# Patient Record
Sex: Female | Born: 1985 | Race: White | Hispanic: No | Marital: Single | Smoking: Current every day smoker
Health system: Southern US, Community
[De-identification: ages and names within clinical notes are randomized; demographics above are authoritative.]

## PROBLEM LIST (undated history)

## (undated) DIAGNOSIS — Z8619 Personal history of other infectious and parasitic diseases: Secondary | ICD-10-CM

## (undated) DIAGNOSIS — G479 Sleep disorder, unspecified: Secondary | ICD-10-CM

## (undated) DIAGNOSIS — F17200 Nicotine dependence, unspecified, uncomplicated: Secondary | ICD-10-CM

## (undated) DIAGNOSIS — L720 Epidermal cyst: Secondary | ICD-10-CM

## (undated) DIAGNOSIS — O139 Gestational [pregnancy-induced] hypertension without significant proteinuria, unspecified trimester: Secondary | ICD-10-CM

## (undated) DIAGNOSIS — F419 Anxiety disorder, unspecified: Secondary | ICD-10-CM

## (undated) DIAGNOSIS — N939 Abnormal uterine and vaginal bleeding, unspecified: Secondary | ICD-10-CM

## (undated) DIAGNOSIS — F32A Depression, unspecified: Secondary | ICD-10-CM

## (undated) DIAGNOSIS — F988 Other specified behavioral and emotional disorders with onset usually occurring in childhood and adolescence: Secondary | ICD-10-CM

## (undated) DIAGNOSIS — N200 Calculus of kidney: Secondary | ICD-10-CM

## (undated) DIAGNOSIS — K8020 Calculus of gallbladder without cholecystitis without obstruction: Secondary | ICD-10-CM

## (undated) DIAGNOSIS — F329 Major depressive disorder, single episode, unspecified: Secondary | ICD-10-CM

## (undated) HISTORY — PX: LEEP: SHX91

## (undated) HISTORY — PX: WISDOM TOOTH EXTRACTION: SHX21

---

## 2011-02-23 DIAGNOSIS — F988 Other specified behavioral and emotional disorders with onset usually occurring in childhood and adolescence: Secondary | ICD-10-CM | POA: Insufficient documentation

## 2013-02-20 NOTE — L&D Delivery Note (Signed)
Delivery Note At 7:24 PM a viable female was delivered via Vaginal, Spontaneous Delivery (Presentation: LOA).  APGAR: 8, 9; weight 9 lb 2.6 oz (4155 g).   Placenta status: delivered, intact, spontaneously.  Cord: 3 vc  with the following complications: none.  Anesthesia: Epidural  Lacerations: none Est. Blood Loss (mL): 300  Mom to postpartum.  Baby to Couplet care / Skin to Skin.  Arling Cerone JEHIEL 12/16/2013, 8:54 PM

## 2013-05-09 DIAGNOSIS — Z9889 Other specified postprocedural states: Secondary | ICD-10-CM | POA: Insufficient documentation

## 2013-05-29 LAB — OB RESULTS CONSOLE HEPATITIS B SURFACE ANTIGEN: HEP B S AG: NEGATIVE

## 2013-05-29 LAB — OB RESULTS CONSOLE ABO/RH: RH TYPE: POSITIVE

## 2013-05-29 LAB — OB RESULTS CONSOLE RPR: RPR: NONREACTIVE

## 2013-05-29 LAB — OB RESULTS CONSOLE HIV ANTIBODY (ROUTINE TESTING): HIV: NONREACTIVE

## 2013-05-29 LAB — OB RESULTS CONSOLE RUBELLA ANTIBODY, IGM: Rubella: IMMUNE

## 2013-06-16 LAB — OB RESULTS CONSOLE GC/CHLAMYDIA
Chlamydia: NEGATIVE
Gonorrhea: NEGATIVE

## 2013-09-29 DIAGNOSIS — F32A Depression, unspecified: Secondary | ICD-10-CM | POA: Insufficient documentation

## 2013-09-29 DIAGNOSIS — F329 Major depressive disorder, single episode, unspecified: Secondary | ICD-10-CM | POA: Insufficient documentation

## 2013-10-07 ENCOUNTER — Encounter: Payer: Self-pay | Admitting: Obstetrics & Gynecology

## 2013-10-07 ENCOUNTER — Ambulatory Visit (INDEPENDENT_AMBULATORY_CARE_PROVIDER_SITE_OTHER): Payer: Medicaid Other | Admitting: Obstetrics & Gynecology

## 2013-10-07 VITALS — BP 131/79 | HR 81 | Ht 66.0 in | Wt 177.0 lb

## 2013-10-07 DIAGNOSIS — Z3490 Encounter for supervision of normal pregnancy, unspecified, unspecified trimester: Secondary | ICD-10-CM | POA: Insufficient documentation

## 2013-10-07 DIAGNOSIS — O3440 Maternal care for other abnormalities of cervix, unspecified trimester: Secondary | ICD-10-CM | POA: Insufficient documentation

## 2013-10-07 DIAGNOSIS — O3443 Maternal care for other abnormalities of cervix, third trimester: Secondary | ICD-10-CM

## 2013-10-07 DIAGNOSIS — Z23 Encounter for immunization: Secondary | ICD-10-CM

## 2013-10-07 DIAGNOSIS — O26899 Other specified pregnancy related conditions, unspecified trimester: Secondary | ICD-10-CM

## 2013-10-07 DIAGNOSIS — M549 Dorsalgia, unspecified: Secondary | ICD-10-CM

## 2013-10-07 DIAGNOSIS — Z3480 Encounter for supervision of other normal pregnancy, unspecified trimester: Secondary | ICD-10-CM | POA: Diagnosis not present

## 2013-10-07 DIAGNOSIS — Z9889 Other specified postprocedural states: Secondary | ICD-10-CM

## 2013-10-07 DIAGNOSIS — O99891 Other specified diseases and conditions complicating pregnancy: Secondary | ICD-10-CM

## 2013-10-07 DIAGNOSIS — O9989 Other specified diseases and conditions complicating pregnancy, childbirth and the puerperium: Secondary | ICD-10-CM

## 2013-10-07 DIAGNOSIS — R109 Unspecified abdominal pain: Secondary | ICD-10-CM

## 2013-10-07 DIAGNOSIS — Z3493 Encounter for supervision of normal pregnancy, unspecified, third trimester: Secondary | ICD-10-CM

## 2013-10-07 MED ORDER — TETANUS-DIPHTH-ACELL PERTUSSIS 5-2.5-18.5 LF-MCG/0.5 IM SUSP: 0.5000 mL | Freq: Once | INTRAMUSCULAR | Status: DC

## 2013-10-07 MED ORDER — CYCLOBENZAPRINE HCL 10 MG PO TABS: 10.0000 mg | ORAL_TABLET | Freq: Three times a day (TID) | ORAL | Status: DC | PRN

## 2013-10-07 NOTE — Progress Notes (Signed)
[facility]  Has two term SVDs.  History of LEEP; normal cervical lengths earlier in pregnancy. Abnormal 1 hr GTT; normal 3 hr GTT [employer] service explained in detail; all questions answered. Emphasized involvement of CNMs, residents, students etc.  Patient is concerned about worsening epigastric/upper abdominal pain that occurs every 2 hours and not related to fetal movement or meals.  Pain is alleviated by lying down. Pain is worsening in intensity, starts in epigastrium and radiates bilaterally to the middle of the upper back.  No nausea, vomiting or other symptoms; just the pain.  She is concerned about gallbladder disease.  Could be musculoskeletal,patient agreed to trial of Flexeril.  Will also obtain abdominal ultrasound; will follow up results and manage accordingly. Tdap given today. No other complaints or concerns.  Labor and fetal movement precautions reviewed.

## 2013-10-07 NOTE — Patient Instructions (Signed)
Return to clinic for any obstetric concerns or go to [facility] for evaluation Breastfeeding Deciding to breastfeed is one of the best choices you can make for you and your baby. A change in hormones during pregnancy causes your breast tissue to grow and increases the number and size of your milk ducts. These hormones also allow proteins, sugars, and fats from your blood supply to make breast milk in your milk-producing glands. Hormones prevent breast milk from being released before your baby is born as well as prompt milk flow after birth. Once breastfeeding has begun, thoughts of your baby, as well as his or her sucking or crying, can stimulate the release of milk from your milk-producing glands.  BENEFITS OF BREASTFEEDING For Your Baby  Your first milk (colostrum) helps your baby's digestive system function better.   There are antibodies in your milk that help your baby fight off infections.   Your baby has a lower incidence of asthma, allergies, and sudden infant death syndrome.   The nutrients in breast milk are better for your baby than infant formulas and are designed uniquely for your baby's needs.   Breast milk improves your baby's brain development.   Your baby is less likely to develop other conditions, such as childhood obesity, asthma, or type 2 diabetes mellitus.  For You   Breastfeeding helps to create a very special bond between you and your baby.   Breastfeeding is convenient. Breast milk is always available at the correct temperature and costs nothing.   Breastfeeding helps to burn calories and helps you lose the weight gained during pregnancy.   Breastfeeding makes your uterus contract to its prepregnancy size faster and slows bleeding (lochia) after you give birth.   Breastfeeding helps to lower your risk of developing type 2 diabetes mellitus, osteoporosis, and breast or ovarian cancer later in life. SIGNS THAT YOUR BABY IS HUNGRY Early Signs of  Hunger  Increased alertness or activity.  Stretching.  Movement of the head from side to side.  Movement of the head and opening of the mouth when the corner of the mouth or cheek is stroked (rooting).  Increased sucking sounds, smacking lips, cooing, sighing, or squeaking.  Hand-to-mouth movements.  Increased sucking of fingers or hands. Late Signs of Hunger  Fussing.  Intermittent crying. Extreme Signs of Hunger Signs of extreme hunger will require calming and consoling before your baby will be able to breastfeed successfully. Do not wait for the following signs of extreme hunger to occur before you initiate breastfeeding:   Restlessness.  A loud, strong cry.   Screaming. BREASTFEEDING BASICS Breastfeeding Initiation  Find a comfortable place to sit or lie down, with your neck and back well supported.  Place a pillow or rolled up blanket under your baby to bring him or her to the level of your breast (if you are seated). Nursing pillows are specially designed to help support your arms and your baby while you breastfeed.  Make sure that your baby's abdomen is facing your abdomen.   Gently massage your breast. With your fingertips, massage from your chest wall toward your nipple in a circular motion. This encourages milk flow. You may need to continue this action during the feeding if your milk flows slowly.  Support your breast with 4 fingers underneath and your thumb above your nipple. Make sure your fingers are well away from your nipple and your baby's mouth.   Stroke your baby's lips gently with your finger or nipple.   When  your baby's mouth is open wide enough, quickly bring your baby to your breast, placing your entire nipple and as much of the colored area around your nipple (areola) as possible into your baby's mouth.   More areola should be visible above your baby's upper lip than below the lower lip.   Your baby's tongue should be between his or her  lower gum and your breast.   Ensure that your baby's mouth is correctly positioned around your nipple (latched). Your baby's lips should create a seal on your breast and be turned out (everted).  It is common for your baby to suck about 2-3 minutes in order to start the flow of breast milk. Latching Teaching your baby how to latch on to your breast properly is very important. An improper latch can cause nipple pain and decreased milk supply for you and poor weight gain in your baby. Also, if your baby is not latched onto your nipple properly, he or she may swallow some air during feeding. This can make your baby fussy. Burping your baby when you switch breasts during the feeding can help to get rid of the air. However, teaching your baby to latch on properly is still the best way to prevent fussiness from swallowing air while breastfeeding. Signs that your baby has successfully latched on to your nipple:    Silent tugging or silent sucking, without causing you pain.   Swallowing heard between every 3-4 sucks.    Muscle movement above and in front of his or her ears while sucking.  Signs that your baby has not successfully latched on to nipple:   Sucking sounds or smacking sounds from your baby while breastfeeding.  Nipple pain. If you think your baby has not latched on correctly, slip your finger into the corner of your baby's mouth to break the suction and place it between your baby's gums. Attempt breastfeeding initiation again. Signs of Successful Breastfeeding Signs from your baby:   A gradual decrease in the number of sucks or complete cessation of sucking.   Falling asleep.   Relaxation of his or her body.   Retention of a small amount of milk in his or her mouth.   Letting go of your breast by himself or herself. Signs from you:  Breasts that have increased in firmness, weight, and size 1-3 hours after feeding.   Breasts that are softer immediately after  breastfeeding.  Increased milk volume, as well as a change in milk consistency and color by the fifth day of breastfeeding.   Nipples that are not sore, cracked, or bleeding. Signs That Your Pecola Leisure is Getting Enough Milk  Wetting at least 3 diapers in a 24-hour period. The urine should be clear and pale yellow by age 688 days.  At least 3 stools in a 24-hour period by age 688 days. The stool should be soft and yellow.  At least 3 stools in a 24-hour period by age 68 days. The stool should be seedy and yellow.  No loss of weight greater than 10% of birth weight during the first 76 days of age.  Average weight gain of 4-7 ounces (113-198 g) per week after age 70 days.  Consistent daily weight gain by age 688 days, without weight loss after the age of 2 weeks. After a feeding, your baby may spit up a small amount. This is common. BREASTFEEDING FREQUENCY AND DURATION Frequent feeding will help you make more milk and can prevent sore nipples and breast engorgement.  Breastfeed when you feel the need to reduce the fullness of your breasts or when your baby shows signs of hunger. This is called "breastfeeding on demand." Avoid introducing a pacifier to your baby while you are working to establish breastfeeding (the first 4-6 weeks after your baby is born). After this time you may choose to use a pacifier. Research has shown that pacifier use during the first year of a baby's life decreases the risk of sudden infant death syndrome (SIDS). Allow your baby to feed on each breast as long as he or she wants. Breastfeed until your baby is finished feeding. When your baby unlatches or falls asleep while feeding from the first breast, offer the second breast. Because newborns are often sleepy in the first few weeks of life, you may need to awaken your baby to get him or her to feed. Breastfeeding times will vary from baby to baby. However, the following rules can serve as a guide to help you ensure that your baby is  properly fed:  Newborns (babies 194 weeks of age or younger) may breastfeed every 1-3 hours.  Newborns should not go longer than 3 hours during the day or 5 hours during the night without breastfeeding.  You should breastfeed your baby a minimum of 8 times in a 24-hour period until you begin to introduce solid foods to your baby at around 696 months of age. BREAST MILK PUMPING Pumping and storing breast milk allows you to ensure that your baby is exclusively fed your breast milk, even at times when you are unable to breastfeed. This is especially important if you are going back to work while you are still breastfeeding or when you are not able to be present during feedings. Your lactation consultant can give you guidelines on how long it is safe to store breast milk.  A breast pump is a machine that allows you to pump milk from your breast into a sterile bottle. The pumped breast milk can then be stored in a refrigerator or freezer. Some breast pumps are operated by hand, while others use electricity. Ask your lactation consultant which type will work best for you. Breast pumps can be purchased, but some hospitals and breastfeeding support groups lease breast pumps on a monthly basis. A lactation consultant can teach you how to hand express breast milk, if you prefer not to use a pump.  CARING FOR YOUR BREASTS WHILE YOU BREASTFEED Nipples can become dry, cracked, and sore while breastfeeding. The following recommendations can help keep your breasts moisturized and healthy:  Avoid using soap on your nipples.   Wear a supportive bra. Although not required, special nursing bras and tank tops are designed to allow access to your breasts for breastfeeding without taking off your entire bra or top. Avoid wearing underwire-style bras or extremely tight bras.  Air dry your nipples for 3-814minutes after each feeding.   Use only cotton bra pads to absorb leaked breast milk. Leaking of breast milk between  feedings is normal.   Use lanolin on your nipples after breastfeeding. Lanolin helps to maintain your skin's normal moisture barrier. If you use pure lanolin, you do not need to wash it off before feeding your baby again. Pure lanolin is not toxic to your baby. You may also hand express a few drops of breast milk and gently massage that milk into your nipples and allow the milk to air dry. In the first few weeks after giving birth, some women experience extremely full breasts (  engorgement). Engorgement can make your breasts feel heavy, warm, and tender to the touch. Engorgement peaks within 3-5 days after you give birth. The following recommendations can help ease engorgement:  Completely empty your breasts while breastfeeding or pumping. You may want to start by applying warm, moist heat (in the shower or with warm water-soaked hand towels) just before feeding or pumping. This increases circulation and helps the milk flow. If your baby does not completely empty your breasts while breastfeeding, pump any extra milk after he or she is finished.  Wear a snug bra (nursing or regular) or tank top for 1-2 days to signal your body to slightly decrease milk production.  Apply ice packs to your breasts, unless this is too uncomfortable for you.  Make sure that your baby is latched on and positioned properly while breastfeeding. If engorgement persists after 48 hours of following these recommendations, contact your health care provider or a Advertising copywriter. OVERALL HEALTH CARE RECOMMENDATIONS WHILE BREASTFEEDING  Eat healthy foods. Alternate between meals and snacks, eating 3 of each per day. Because what you eat affects your breast milk, some of the foods may make your baby more irritable than usual. Avoid eating these foods if you are sure that they are negatively affecting your baby.  Drink milk, fruit juice, and water to satisfy your thirst (about 10 glasses a day).   Rest often, relax, and  continue to take your prenatal vitamins to prevent fatigue, stress, and anemia.  Continue breast self-awareness checks.  Avoid chewing and smoking tobacco.  Avoid alcohol and drug use. Some medicines that may be harmful to your baby can pass through breast milk. It is important to ask your health care provider before taking any medicine, including all over-the-counter and prescription medicine as well as vitamin and herbal supplements. It is possible to become pregnant while breastfeeding. If birth control is desired, ask your health care provider about options that will be safe for your baby. SEEK MEDICAL CARE IF:   You feel like you want to stop breastfeeding or have become frustrated with breastfeeding.  You have painful breasts or nipples.  Your nipples are cracked or bleeding.  Your breasts are red, tender, or warm.  You have a swollen area on either breast.  You have a fever or chills.  You have nausea or vomiting.  You have drainage other than breast milk from your nipples.  Your breasts do not become full before feedings by the fifth day after you give birth.  You feel sad and depressed.  Your baby is too sleepy to eat well.  Your baby is having trouble sleeping.   Your baby is wetting less than 3 diapers in a 24-hour period.  Your baby has less than 3 stools in a 24-hour period.  Your baby's skin or the white part of his or her eyes becomes yellow.   Your baby is not gaining weight by 42 days of age. SEEK IMMEDIATE MEDICAL CARE IF:   Your baby is overly tired (lethargic) and does not want to wake up and feed.  Your baby develops an unexplained fever. Document Released: 02/06/2005 Document Revised: 02/11/2013 Document Reviewed: 07/31/2012 [facility] Patient Information 2015 [place], [place]. This information is not intended to replace advice given to you by your health care provider. Make sure you discuss any questions you have with your health care provider.

## 2013-10-10 ENCOUNTER — Ambulatory Visit (HOSPITAL_COMMUNITY)
Admission: RE | Admit: 2013-10-10 | Discharge: 2013-10-10 | Disposition: A | Discharge status: Home / Self Care | Payer: Medicaid Other | Source: Ambulatory Visit | Attending: Obstetrics & Gynecology | Admitting: Obstetrics & Gynecology

## 2013-10-10 DIAGNOSIS — R109 Unspecified abdominal pain: Secondary | ICD-10-CM

## 2013-10-10 DIAGNOSIS — O26899 Other specified pregnancy related conditions, unspecified trimester: Secondary | ICD-10-CM

## 2013-10-10 DIAGNOSIS — R1013 Epigastric pain: Secondary | ICD-10-CM | POA: Insufficient documentation

## 2013-10-10 DIAGNOSIS — K8020 Calculus of gallbladder without cholecystitis without obstruction: Secondary | ICD-10-CM | POA: Insufficient documentation

## 2013-10-14 ENCOUNTER — Telehealth: Payer: Self-pay | Admitting: *Deleted

## 2013-10-14 NOTE — Telephone Encounter (Signed)
Discussed ultrasound results with the patient and she says that she is having increased pain and now severe itching.  I have advised patient to come to discuss options with the physician tomorrow.

## 2013-10-15 ENCOUNTER — Ambulatory Visit (INDEPENDENT_AMBULATORY_CARE_PROVIDER_SITE_OTHER): Payer: Medicaid Other | Admitting: Obstetrics & Gynecology

## 2013-10-15 ENCOUNTER — Ambulatory Visit (HOSPITAL_COMMUNITY): Payer: Medicaid Other

## 2013-10-15 ENCOUNTER — Encounter: Payer: Self-pay | Admitting: Obstetrics & Gynecology

## 2013-10-15 VITALS — BP 102/67 | HR 87 | Wt 178.0 lb

## 2013-10-15 DIAGNOSIS — Z3480 Encounter for supervision of other normal pregnancy, unspecified trimester: Secondary | ICD-10-CM

## 2013-10-15 DIAGNOSIS — R1011 Right upper quadrant pain: Secondary | ICD-10-CM

## 2013-10-15 DIAGNOSIS — L299 Pruritus, unspecified: Secondary | ICD-10-CM

## 2013-10-15 MED ORDER — METOCLOPRAMIDE HCL 10 MG PO TABS: 10.0000 mg | ORAL_TABLET | Freq: Three times a day (TID) | ORAL | Status: DC

## 2013-10-15 MED ORDER — RANITIDINE HCL 300 MG PO TABS: 300.0000 mg | ORAL_TABLET | Freq: Every day | ORAL | Status: DC

## 2013-10-15 NOTE — Progress Notes (Signed)
Increase in itching all over mainly at night.  Increase in gallstone/kidney stone pain.

## 2013-10-15 NOTE — Progress Notes (Signed)
Pt reports itching all over.  Was diagnosed with a 1.9cm gallstone on abd sono.   She reports pain in the upper abd +FM, No ctx, No LOF, No VB Exam- benign    reglan and Zantac Labs: Bile acids and CMP F/u 2 weeks or sooner prn

## 2013-10-15 NOTE — Patient Instructions (Addendum)
Abdominal Pain Many things can cause abdominal pain. Usually, abdominal pain is not caused by a disease and will improve without treatment. It can often be observed and treated at home. Your health care provider will do a physical exam and possibly order blood tests and X-rays to help determine the seriousness of your pain. However, in many cases, more time must pass before a clear cause of the pain can be found. Before that point, your health care provider may not know if you need more testing or further treatment. HOME CARE INSTRUCTIONS  Monitor your abdominal pain for any changes. The following actions may help to alleviate any discomfort you are experiencing:  Only take over-the-counter or prescription medicines as directed by your health care provider.  Do not take laxatives unless directed to do so by your health care provider.  Try a clear liquid diet (broth, tea, or water) as directed by your health care provider. Slowly move to a bland diet as tolerated. SEEK MEDICAL CARE IF:  You have unexplained abdominal pain.  You have abdominal pain associated with nausea or diarrhea.  You have pain when you urinate or have a bowel movement.  You experience abdominal pain that wakes you in the night.  You have abdominal pain that is worsened or improved by eating food.  You have abdominal pain that is worsened with eating fatty foods.  You have a fever. SEEK IMMEDIATE MEDICAL CARE IF:   Your pain does not go away within 2 hours.  You keep throwing up (vomiting).  Your pain is felt only in portions of the abdomen, such as the right side or the left lower portion of the abdomen.  You pass bloody or black tarry stools. MAKE SURE YOU:  Understand these instructions.   Will watch your condition.   Will get help right away if you are not doing well or get worse.  Document Released: 11/16/2004 Document Revised: 02/11/2013 Document Reviewed: 10/16/2012 [facility] Patient Information  2015 [place], [place]. This information is not intended to replace advice given to you by your health care provider. Make sure you discuss any questions you have with your health care provider. Third Trimester of Pregnancy The third trimester is from week 29 through week 42, months 7 through 9. The third trimester is a time when the fetus is growing rapidly. At the end of the ninth month, the fetus is about 20 inches in length and weighs 6-10 pounds.  BODY CHANGES Your body goes through many changes during pregnancy. The changes vary from woman to woman.   Your weight will continue to increase. You can expect to gain 25-35 pounds (11-16 kg) by the end of the pregnancy.  You may begin to get stretch marks on your hips, abdomen, and breasts.  You may urinate more often because the fetus is moving lower into your pelvis and pressing on your bladder.  You may develop or continue to have heartburn as a result of your pregnancy.  You may develop constipation because certain hormones are causing the muscles that push waste through your intestines to slow down.  You may develop hemorrhoids or swollen, bulging veins (varicose veins).  You may have pelvic pain because of the weight gain and pregnancy hormones relaxing your joints between the bones in your pelvis. Backaches may result from overexertion of the muscles supporting your posture.  You may have changes in your hair. These can include thickening of your hair, rapid growth, and changes in texture. Some women also have  hair loss during or after pregnancy, or hair that feels dry or thin. Your hair will most likely return to normal after your baby is born.  Your breasts will continue to grow and be tender. A yellow discharge may leak from your breasts called colostrum.  Your belly button may stick out.  You may feel short of breath because of your expanding uterus.  You may notice the fetus "dropping," or moving lower in your abdomen.  You may  have a bloody mucus discharge. This usually occurs a few days to a week before labor begins.  Your cervix becomes thin and soft (effaced) near your due date. WHAT TO EXPECT AT YOUR PRENATAL EXAMS  You will have prenatal exams every 2 weeks until week 36. Then, you will have weekly prenatal exams. During a routine prenatal visit:  You will be weighed to make sure you and the fetus are growing normally.  Your blood pressure is taken.  Your abdomen will be measured to track your baby's growth.  The fetal heartbeat will be listened to.  Any test results from the previous visit will be discussed.  You may have a cervical check near your due date to see if you have effaced. At around 36 weeks, your caregiver will check your cervix. At the same time, your caregiver will also perform a test on the secretions of the vaginal tissue. This test is to determine if a type of bacteria, Group B streptococcus, is present. Your caregiver will explain this further. Your caregiver may ask you:  What your birth plan is.  How you are feeling.  If you are feeling the baby move.  If you have had any abnormal symptoms, such as leaking fluid, bleeding, severe headaches, or abdominal cramping.  If you have any questions. Other tests or screenings that may be performed during your third trimester include:  Blood tests that check for low iron levels (anemia).  Fetal testing to check the health, activity level, and growth of the fetus. Testing is done if you have certain medical conditions or if there are problems during the pregnancy. FALSE LABOR You may feel small, irregular contractions that eventually go away. These are called Braxton Hicks contractions, or false labor. Contractions may last for hours, days, or even weeks before true labor sets in. If contractions come at regular intervals, intensify, or become painful, it is best to be seen by your caregiver.  SIGNS OF LABOR   Menstrual-like  cramps.  Contractions that are 5 minutes apart or less.  Contractions that start on the top of the uterus and spread down to the lower abdomen and back.  A sense of increased pelvic pressure or back pain.  A watery or bloody mucus discharge that comes from the vagina. If you have any of these signs before the 37th week of pregnancy, call your caregiver right away. You need to go to the hospital to get checked immediately. HOME CARE INSTRUCTIONS   Avoid all smoking, herbs, alcohol, and unprescribed drugs. These chemicals affect the formation and growth of the baby.  Follow your caregiver's instructions regarding medicine use. There are medicines that are either safe or unsafe to take during pregnancy.  Exercise only as directed by your caregiver. Experiencing uterine cramps is a good sign to stop exercising.  Continue to eat regular, healthy meals.  Wear a good support bra for breast tenderness.  Do not use hot tubs, steam rooms, or saunas.  Wear your seat belt at all times when  driving.  Avoid raw meat, uncooked cheese, cat litter boxes, and soil used by cats. These carry germs that can cause birth defects in the baby.  Take your prenatal vitamins.  Try taking a stool softener (if your caregiver approves) if you develop constipation. Eat more high-fiber foods, such as fresh vegetables or fruit and whole grains. Drink plenty of fluids to keep your urine clear or pale yellow.  Take warm sitz baths to soothe any pain or discomfort caused by hemorrhoids. Use hemorrhoid cream if your caregiver approves.  If you develop varicose veins, wear support hose. Elevate your feet for 15 minutes, 3-4 times a day. Limit salt in your diet.  Avoid heavy lifting, wear low heal shoes, and practice good posture.  Rest a lot with your legs elevated if you have leg cramps or low back pain.  Visit your dentist if you have not gone during your pregnancy. Use a soft toothbrush to brush your teeth and  be gentle when you floss.  A sexual relationship may be continued unless your caregiver directs you otherwise.  Do not travel far distances unless it is absolutely necessary and only with the approval of your caregiver.  Take prenatal classes to understand, practice, and ask questions about the labor and delivery.  Make a trial run to the hospital.  Pack your hospital bag.  Prepare the baby's nursery.  Continue to go to all your prenatal visits as directed by your caregiver. SEEK MEDICAL CARE IF:  You are unsure if you are in labor or if your water has broken.  You have dizziness.  You have mild pelvic cramps, pelvic pressure, or nagging pain in your abdominal area.  You have persistent nausea, vomiting, or diarrhea.  You have a bad smelling vaginal discharge.  You have pain with urination. SEEK IMMEDIATE MEDICAL CARE IF:   You have a fever.  You are leaking fluid from your vagina.  You have spotting or bleeding from your vagina.  You have severe abdominal cramping or pain.  You have rapid weight loss or gain.  You have shortness of breath with chest pain.  You notice sudden or extreme swelling of your face, hands, ankles, feet, or legs.  You have not felt your baby move in over an hour.  You have severe headaches that do not go away with medicine.  You have vision changes. Document Released: 01/31/2001 Document Revised: 02/11/2013 Document Reviewed: 04/09/2012 [facility] Patient Information 2015 [place], [place]. This information is not intended to replace advice given to you by your health care provider. Make sure you discuss any questions you have with your health care provider.

## 2013-10-16 LAB — COMPREHENSIVE METABOLIC PANEL
ALT: 9 U/L (ref 0–35)
AST: 15 U/L (ref 0–37)
Albumin: 3.2 g/dL — ABNORMAL LOW (ref 3.5–5.2)
Alkaline Phosphatase: 77 U/L (ref 39–117)
BUN: 5 mg/dL — ABNORMAL LOW (ref 6–23)
CO2: 25 meq/L (ref 19–32)
CREATININE: 0.54 mg/dL (ref 0.50–1.10)
Calcium: 8.5 mg/dL (ref 8.4–10.5)
Chloride: 103 mEq/L (ref 96–112)
GLUCOSE: 68 mg/dL — AB (ref 70–99)
Potassium: 3.9 mEq/L (ref 3.5–5.3)
SODIUM: 137 meq/L (ref 135–145)
TOTAL PROTEIN: 5.9 g/dL — AB (ref 6.0–8.3)
Total Bilirubin: 0.2 mg/dL (ref 0.2–1.2)

## 2013-10-17 LAB — BILE ACIDS, TOTAL: BILE ACIDS TOTAL: 9 umol/L (ref 0–19)

## 2013-10-20 ENCOUNTER — Telehealth: Payer: Self-pay | Admitting: *Deleted

## 2013-10-20 NOTE — Telephone Encounter (Signed)
Patient called for test results.  Notified patient of normal lab results.  She has follow up on the 8th of September and will call if she feels like her symptoms are worsening to be seen sooner.

## 2013-10-21 ENCOUNTER — Encounter: Payer: Medicaid Other | Admitting: Obstetrics & Gynecology

## 2013-10-28 ENCOUNTER — Ambulatory Visit (INDEPENDENT_AMBULATORY_CARE_PROVIDER_SITE_OTHER): Payer: Medicaid Other | Admitting: Obstetrics & Gynecology

## 2013-10-28 VITALS — BP 126/74 | HR 85 | Wt 179.0 lb

## 2013-10-28 DIAGNOSIS — O9989 Other specified diseases and conditions complicating pregnancy, childbirth and the puerperium: Secondary | ICD-10-CM

## 2013-10-28 DIAGNOSIS — O99713 Diseases of the skin and subcutaneous tissue complicating pregnancy, third trimester: Principal | ICD-10-CM

## 2013-10-28 DIAGNOSIS — L299 Pruritus, unspecified: Secondary | ICD-10-CM

## 2013-10-28 DIAGNOSIS — Z3480 Encounter for supervision of other normal pregnancy, unspecified trimester: Secondary | ICD-10-CM

## 2013-10-28 MED ORDER — HYDROXYZINE HCL 25 MG PO TABS: 25.0000 mg | ORAL_TABLET | Freq: Four times a day (QID) | ORAL | Status: DC | PRN

## 2013-10-28 NOTE — Patient Instructions (Signed)
Cholestasis of Pregnancy °Cholestasis refers to any condition that causes the flow of the digestive fluid (bile) produced by your liver to slow or stop. Cholestasis of pregnancy is most common toward the end of pregnancy (third trimester), but it can occur any time during your pregnancy. The condition often goes away soon after your baby is born.  °Cholestasis may be uncomfortable but is usually harmless to you. However, it can be harmful to your baby. Cholestasis may increase the risk that your baby will be born too early (preterm delivery).  °CAUSES  °The cause of cholestasis of pregnancy is not known. Pregnancy hormones may affect the way your gallbladder functions. Your gallbladder normally holds the bile from your liver until you need it to help digest fat in your diet. Pregnancy hormones may cause the flow of bile to slow down and back up into your liver. Bile may then get into your bloodstream and cause cholestasis symptoms. °RISK FACTORS °You may be at increased risk if: °· You had cholestasis during a previous pregnancy. °· You have a family history of cholestasis. °· You have liver problems. °· You are having twins. °SIGNS AND SYMPTOMS  °The most common symptom of cholestasis of pregnancy is intense itching, especially on the palms of your hands and soles of your feet. The itching can spread to the rest of your body and is often worse at night. You will not usually have a rash. Other symptoms may include:  °· Feeling tired.   °· Yellowish discoloration of your skin and the whites of your eyes (jaundice).   °· Dark-colored urine.   °· Light-colored stools. °· Poor appetite.    °DIAGNOSIS  °Your health care provider will take your medical history and do a physical exam. You may have blood tests to check your liver function, bile level, and bilirubin level.  °TREATMENT  °Treatment is meant to make you more comfortable and keep your baby safe. Your health care provider may prescribe medicine to relieve your  itching. The medicine used may also improve your blood test results and help keep your baby safe. Your health care provider may also give you vitamin K before delivery to prevent excessive bleeding.  °Your health care provider may want to check your baby (fetal monitoring) frequently, as often as every 2 weeks. Once your baby's lungs have developed enough, your health care provider may recommend starting (inducing) your labor and delivery by week 37 of your pregnancy. °HOME CARE INSTRUCTIONS  °· Only use anti-itch creams and take medicines as directed by your health care provider. °· Take cool baths to soothe your itching.   °· Keep your fingernails short to prevent skin irritation from scratching.   °· Keep all your appointments for fetal monitoring. °SEEK MEDICAL CARE IF:  °Your symptoms get worse, even with treatment. °SEEK IMMEDIATE MEDICAL CARE IF: °You go into early labor at home. °MAKE SURE YOU: °· Understand these instructions. °· Will watch your condition. °· Will get help right away if you are not doing well or get worse. °Document Released: 02/04/2000 Document Revised: 06/23/2013 Document Reviewed: 11/29/2012 °[employer]® Patient Information ©2015 [employer]. This information is not intended to replace advice given to you by your health care provider. Make sure you discuss any questions you have with your health care provider. ° °

## 2013-10-28 NOTE — Progress Notes (Signed)
Still has itching; 10/15/13 Bile acid level was 9.  On Benadryl now, helps a little. Atarax prescribed in lieu of Benadryl for now. Will follow up labs; discussed concern about ICP with patient and its implications. No other complaints or concerns.  Labor and fetal movement precautions reviewed.

## 2013-10-29 LAB — COMPREHENSIVE METABOLIC PANEL
ALK PHOS: 78 U/L (ref 39–117)
ALT: 11 U/L (ref 0–35)
AST: 14 U/L (ref 0–37)
Albumin: 2.9 g/dL — ABNORMAL LOW (ref 3.5–5.2)
BUN: 5 mg/dL — ABNORMAL LOW (ref 6–23)
CO2: 25 mEq/L (ref 19–32)
Calcium: 8.6 mg/dL (ref 8.4–10.5)
Chloride: 103 mEq/L (ref 96–112)
Creat: 0.56 mg/dL (ref 0.50–1.10)
Glucose, Bld: 90 mg/dL (ref 70–99)
Potassium: 4.1 mEq/L (ref 3.5–5.3)
SODIUM: 134 meq/L — AB (ref 135–145)
TOTAL PROTEIN: 5.2 g/dL — AB (ref 6.0–8.3)
Total Bilirubin: 0.2 mg/dL (ref 0.2–1.2)

## 2013-10-30 LAB — BILE ACIDS, TOTAL: Bile Acids Total: 8 umol/L (ref 0–19)

## 2013-11-11 ENCOUNTER — Encounter: Payer: Medicaid Other | Admitting: Family Medicine

## 2013-11-19 ENCOUNTER — Ambulatory Visit (INDEPENDENT_AMBULATORY_CARE_PROVIDER_SITE_OTHER): Payer: Medicaid Other | Admitting: Obstetrics & Gynecology

## 2013-11-19 ENCOUNTER — Encounter: Payer: Self-pay | Admitting: Obstetrics & Gynecology

## 2013-11-19 VITALS — BP 132/81 | HR 82 | Wt 178.6 lb

## 2013-11-19 DIAGNOSIS — Z113 Encounter for screening for infections with a predominantly sexual mode of transmission: Secondary | ICD-10-CM

## 2013-11-19 DIAGNOSIS — Z3490 Encounter for supervision of normal pregnancy, unspecified, unspecified trimester: Secondary | ICD-10-CM

## 2013-11-19 DIAGNOSIS — Z3480 Encounter for supervision of other normal pregnancy, unspecified trimester: Secondary | ICD-10-CM

## 2013-11-19 DIAGNOSIS — Z3685 Encounter for antenatal screening for Streptococcus B: Secondary | ICD-10-CM

## 2013-11-19 LAB — OB RESULTS CONSOLE GC/CHLAMYDIA
Chlamydia: NEGATIVE
GC PROBE AMP, GENITAL: NEGATIVE

## 2013-11-19 LAB — OB RESULTS CONSOLE GBS: STREP GROUP B AG: NEGATIVE

## 2013-11-19 NOTE — Progress Notes (Signed)
Seen last  Tuesday in Emergency room for suspected PE  Ended up being bronchitis.  She was treated with z-pack but it still coughing tremendously.  Yesterday she heard a pop and feels like she may have broken a rib.  She was seen a urgent care and they did not want to do any further x-rays due to all of the radiation that she had in the hospital when she was admitted for 2 days.

## 2013-11-19 NOTE — Progress Notes (Signed)
Routine visit. Good FM. Reassurance given regarding her ribs and their recovery. GBS and cervical cultures obtained today. Labor precautions reviewed.

## 2013-11-20 LAB — GC/CHLAMYDIA PROBE AMP
CT Probe RNA: NEGATIVE
GC Probe RNA: NEGATIVE

## 2013-11-22 LAB — CULTURE, BETA STREP (GROUP B ONLY)

## 2013-11-25 ENCOUNTER — Encounter: Payer: Self-pay | Admitting: Obstetrics & Gynecology

## 2013-11-26 ENCOUNTER — Ambulatory Visit (INDEPENDENT_AMBULATORY_CARE_PROVIDER_SITE_OTHER): Payer: Medicaid Other | Admitting: Obstetrics & Gynecology

## 2013-11-26 VITALS — BP 137/81 | HR 80 | Wt 181.0 lb

## 2013-11-26 DIAGNOSIS — O99713 Diseases of the skin and subcutaneous tissue complicating pregnancy, third trimester: Principal | ICD-10-CM

## 2013-11-26 DIAGNOSIS — Z3493 Encounter for supervision of normal pregnancy, unspecified, third trimester: Secondary | ICD-10-CM

## 2013-11-26 DIAGNOSIS — O26893 Other specified pregnancy related conditions, third trimester: Secondary | ICD-10-CM

## 2013-11-26 DIAGNOSIS — Z3A37 37 weeks gestation of pregnancy: Secondary | ICD-10-CM

## 2013-11-26 DIAGNOSIS — L299 Pruritus, unspecified: Secondary | ICD-10-CM

## 2013-11-26 DIAGNOSIS — O368130 Decreased fetal movements, third trimester, not applicable or unspecified: Secondary | ICD-10-CM

## 2013-11-26 LAB — COMPREHENSIVE METABOLIC PANEL
ALBUMIN: 3 g/dL — AB (ref 3.5–5.2)
ALT: 12 U/L (ref 0–35)
AST: 19 U/L (ref 0–37)
Alkaline Phosphatase: 104 U/L (ref 39–117)
BUN: 6 mg/dL (ref 6–23)
CALCIUM: 8.4 mg/dL (ref 8.4–10.5)
CHLORIDE: 102 meq/L (ref 96–112)
CO2: 25 meq/L (ref 19–32)
Creat: 0.53 mg/dL (ref 0.50–1.10)
GLUCOSE: 82 mg/dL (ref 70–99)
Potassium: 4.1 mEq/L (ref 3.5–5.3)
SODIUM: 135 meq/L (ref 135–145)
TOTAL PROTEIN: 5.8 g/dL — AB (ref 6.0–8.3)
Total Bilirubin: 0.3 mg/dL (ref 0.2–1.2)

## 2013-11-26 NOTE — Patient Instructions (Signed)
Return to clinic for any obstetric concerns or go to MAU for evaluation  

## 2013-11-26 NOTE — Progress Notes (Signed)
Pruritus has worsened again, got better for a while but back now in the last two days.  Bile acids rechecked with CMET; will follow up results and manage accordingly. Also reports associated decreased fetal movement in last two days; feels about 2 kicks a day and at baseline fetus was very active. NST in clinic today was reactive. GBS negative. No other complaints or concerns.  Strict labor and fetal movement precautions reviewed.

## 2013-11-28 LAB — BILE ACIDS, TOTAL: BILE ACIDS TOTAL: 9 umol/L (ref 0–19)

## 2013-12-01 ENCOUNTER — Encounter: Payer: Self-pay | Admitting: Obstetrics & Gynecology

## 2013-12-01 ENCOUNTER — Ambulatory Visit (INDEPENDENT_AMBULATORY_CARE_PROVIDER_SITE_OTHER): Payer: Medicaid Other | Admitting: Obstetrics & Gynecology

## 2013-12-01 VITALS — BP 133/78 | HR 99 | Wt 182.8 lb

## 2013-12-01 DIAGNOSIS — O368131 Decreased fetal movements, third trimester, fetus 1: Secondary | ICD-10-CM

## 2013-12-01 DIAGNOSIS — O99713 Diseases of the skin and subcutaneous tissue complicating pregnancy, third trimester: Principal | ICD-10-CM

## 2013-12-01 DIAGNOSIS — Z3493 Encounter for supervision of normal pregnancy, unspecified, third trimester: Secondary | ICD-10-CM

## 2013-12-01 DIAGNOSIS — O26893 Other specified pregnancy related conditions, third trimester: Secondary | ICD-10-CM

## 2013-12-01 DIAGNOSIS — L299 Pruritus, unspecified: Secondary | ICD-10-CM

## 2013-12-01 NOTE — Progress Notes (Signed)
NST is reactive, BP recheck is 133/78.  Patient will return in 3 days for BP check and NST if needed.

## 2013-12-01 NOTE — Patient Instructions (Signed)
Return to clinic for any obstetric concerns or go to MAU for evaluation  

## 2013-12-01 NOTE — Progress Notes (Signed)
Bile acids on 11/26/13 was 9, normal CMET.  BP today is 138/92, denies any headaches, visual symptoms, abdominal pain.  Reevaluate at next visit later this week. Improved fetal movement perceived by patient.  Will still obtain NST today given elevated BP.  Preeclampsia, labor and fetal movement precautions reviewed. Addendum: NST performed today was reviewed and was found to be reactive.  Reevaluate BP later in the week

## 2013-12-04 ENCOUNTER — Encounter: Payer: Self-pay | Admitting: Obstetrics and Gynecology

## 2013-12-04 ENCOUNTER — Encounter (HOSPITAL_COMMUNITY): Payer: Self-pay | Admitting: *Deleted

## 2013-12-04 ENCOUNTER — Ambulatory Visit (INDEPENDENT_AMBULATORY_CARE_PROVIDER_SITE_OTHER): Payer: Medicaid Other | Admitting: Obstetrics and Gynecology

## 2013-12-04 ENCOUNTER — Inpatient Hospital Stay (HOSPITAL_COMMUNITY)
Admission: AD | Admit: 2013-12-04 | Discharge: 2013-12-04 | Disposition: A | Discharge status: Home / Self Care | Payer: Medicaid Other | Source: Ambulatory Visit | Attending: Family Medicine | Admitting: Family Medicine

## 2013-12-04 VITALS — BP 130/90 | HR 87 | Wt 182.0 lb

## 2013-12-04 DIAGNOSIS — R030 Elevated blood-pressure reading, without diagnosis of hypertension: Secondary | ICD-10-CM | POA: Diagnosis present

## 2013-12-04 DIAGNOSIS — O9989 Other specified diseases and conditions complicating pregnancy, childbirth and the puerperium: Secondary | ICD-10-CM | POA: Diagnosis not present

## 2013-12-04 DIAGNOSIS — Z9889 Other specified postprocedural states: Principal | ICD-10-CM

## 2013-12-04 DIAGNOSIS — Z3493 Encounter for supervision of normal pregnancy, unspecified, third trimester: Secondary | ICD-10-CM

## 2013-12-04 DIAGNOSIS — O26893 Other specified pregnancy related conditions, third trimester: Secondary | ICD-10-CM

## 2013-12-04 DIAGNOSIS — O163 Unspecified maternal hypertension, third trimester: Secondary | ICD-10-CM

## 2013-12-04 DIAGNOSIS — Z3A38 38 weeks gestation of pregnancy: Secondary | ICD-10-CM | POA: Insufficient documentation

## 2013-12-04 DIAGNOSIS — L299 Pruritus, unspecified: Secondary | ICD-10-CM

## 2013-12-04 DIAGNOSIS — O99333 Smoking (tobacco) complicating pregnancy, third trimester: Secondary | ICD-10-CM | POA: Diagnosis not present

## 2013-12-04 DIAGNOSIS — O3443 Maternal care for other abnormalities of cervix, third trimester: Secondary | ICD-10-CM

## 2013-12-04 DIAGNOSIS — O99713 Diseases of the skin and subcutaneous tissue complicating pregnancy, third trimester: Secondary | ICD-10-CM

## 2013-12-04 HISTORY — DX: Calculus of gallbladder without cholecystitis without obstruction: K80.20

## 2013-12-04 HISTORY — DX: Calculus of kidney: N20.0

## 2013-12-04 LAB — CBC
HEMATOCRIT: 35.3 % — AB (ref 36.0–46.0)
Hemoglobin: 11.4 g/dL — ABNORMAL LOW (ref 12.0–15.0)
MCH: 25.6 pg — ABNORMAL LOW (ref 26.0–34.0)
MCHC: 32.3 g/dL (ref 30.0–36.0)
MCV: 79.3 fL (ref 78.0–100.0)
Platelets: 208 10*3/uL (ref 150–400)
RBC: 4.45 MIL/uL (ref 3.87–5.11)
RDW: 15 % (ref 11.5–15.5)
WBC: 11 10*3/uL — ABNORMAL HIGH (ref 4.0–10.5)

## 2013-12-04 LAB — COMPREHENSIVE METABOLIC PANEL
ALT: 11 U/L (ref 0–35)
ANION GAP: 14 (ref 5–15)
AST: 18 U/L (ref 0–37)
Albumin: 2.3 g/dL — ABNORMAL LOW (ref 3.5–5.2)
Alkaline Phosphatase: 134 U/L — ABNORMAL HIGH (ref 39–117)
BUN: 5 mg/dL — AB (ref 6–23)
CO2: 22 mEq/L (ref 19–32)
Calcium: 8.5 mg/dL (ref 8.4–10.5)
Chloride: 102 mEq/L (ref 96–112)
Creatinine, Ser: 0.54 mg/dL (ref 0.50–1.10)
GFR calc Af Amer: >90 mL/min (ref >90)
GFR calc non Af Amer: >90 mL/min (ref >90)
Glucose, Bld: 101 mg/dL — ABNORMAL HIGH (ref 70–99)
Potassium: 3.7 mEq/L (ref 3.7–5.3)
SODIUM: 138 meq/L (ref 137–147)
TOTAL PROTEIN: 5.8 g/dL — AB (ref 6.0–8.3)
Total Bilirubin: <0.2 mg/dL — ABNORMAL LOW (ref 0.3–1.2)

## 2013-12-04 LAB — PROTEIN / CREATININE RATIO, URINE
Creatinine, Urine: 73.49 mg/dL
PROTEIN CREATININE RATIO: 0.16 — AB (ref 0.00–0.15)
Total Protein, Urine: 11.9 mg/dL

## 2013-12-04 MED ORDER — OXYCODONE-ACETAMINOPHEN 5-325 MG PO TABS: 2.0000 | ORAL_TABLET | Freq: Once | ORAL | Status: DC

## 2013-12-04 NOTE — Discharge Instructions (Signed)

## 2013-12-04 NOTE — MAU Provider Note (Signed)
History     CSN: [id number]  Arrival date and time: 12/04/13 1121   None     Chief Complaint  Patient presents with  . Hypertension   HPI  Patient is 28 y.o. [id number] here for preeclampsia eval.  +FM, denies LOF, VB, contractions, vaginal discharge.   here for preeclampsia evaluation, referred from [facility] for evaluation after blood pressure of 130/92 noted.  Patient very concerned reporting her blood pressure has been progressively worsening at each clinic visit. - no headache, no RUQ, no visual changes, +swelling hands/feet.   Past Medical History  Diagnosis Date  . Medical history non-contributory   . Gall stone   . Kidney calculi     Past Surgical History  Procedure Laterality Date  . No past surgeries      Family History  Problem Relation Age of Onset  . Cancer Mother     skin  . Diabetes Mother   . Hypertension Mother   . Hypertension Father   . Cancer Maternal Grandmother     breast  . Cancer Maternal Grandfather     lung  . Cancer Paternal Grandmother     lung  . Cancer Paternal Grandfather     lung    History  Substance Use Topics  . Smoking status: Current Every Day Smoker -- 0.50 packs/day  . Smokeless tobacco: Never Used  . Alcohol Use: No    Allergies:  Allergies  Allergen Reactions  . Cefaclor Other (See Comments)    Query joint inflammation    Facility-administered medications prior to admission  Medication Dose Route Frequency Provider Last Rate Last Dose  . Tdap (BOOSTRIX) injection 0.5 mL  0.5 mL Intramuscular Once Tereso NewcomerUgonna A Anyanwu, MD       Prescriptions prior to admission  Medication Sig Dispense Refill  . acetaminophen (TYLENOL) 500 MG tablet Take 1,000 mg by mouth daily as needed for mild pain or moderate pain.      . cyclobenzaprine (FLEXERIL) 10 MG tablet Take 1 tablet (10 mg total) by mouth 3 (three) times daily as needed for muscle spasms.  30 tablet  2  . ferrous sulfate 325 (65 FE) MG tablet Take 325 mg  by mouth daily with breakfast.      . Prenatal Vit-Fe Fumarate-FA (PRENATAL MULTIVITAMIN) TABS tablet Take 1 tablet by mouth daily at 12 noon.        Review of Systems  Constitutional: Negative for fever and chills.  Respiratory: Negative for cough and shortness of breath.   Cardiovascular: Negative for chest pain and leg swelling.  Gastrointestinal: Negative for heartburn, nausea, vomiting and diarrhea.  Genitourinary: Negative for dysuria, urgency, frequency and hematuria.  Neurological:       No headache   Physical Exam   Blood pressure 120/80, pulse 88, temperature 97.6 F (36.4 C), temperature source Oral, resp. rate 16, height 5' 5.5" (1.664 m), weight 182 lb (82.555 kg), last menstrual period 03/10/2013.  Physical Exam  Constitutional: She is oriented to person, place, and time. She appears well-developed and well-nourished.  HENT:  Head: Normocephalic and atraumatic.  Eyes: Conjunctivae and EOM are normal.  Neck: Normal range of motion.  Cardiovascular: Normal rate, regular rhythm and normal heart sounds.   Respiratory: Effort normal. No respiratory distress.  GI: Soft. Bowel sounds are normal. She exhibits no distension. There is no tenderness.  Musculoskeletal: Normal range of motion. She exhibits edema (trace pedal, no swelling in hands noted).  Neurological: She is alert and oriented  to person, place, and time.  Skin: Skin is warm and dry. No erythema.    MAU Course  Procedures  MDM NST: reactive  CBC    Component Value Date/Time   WBC 11.0* 12/04/2013 1152   RBC 4.45 12/04/2013 1152   HGB 11.4* 12/04/2013 1152   HCT 35.3* 12/04/2013 1152   PLT 208 12/04/2013 1152   MCV 79.3 12/04/2013 1152   MCH 25.6* 12/04/2013 1152   MCHC 32.3 12/04/2013 1152   RDW 15.0 12/04/2013 1152    Results for Felicia BruinsWILSON, Jaquay (MRN [id number]) as of 12/05/2013 12:36  Ref. Range 12/04/2013 11:24 12/04/2013 11:52  WBC Latest Range: 4.0-10.5 K/uL  11.0 (H)  RBC Latest Range:  3.87-5.11 MIL/uL  4.45  Hemoglobin Latest Range: 12.0-15.0 g/dL  40.911.4 (L)  HCT Latest Range: 36.0-46.0 %  35.3 (L)  MCV Latest Range: 78.0-100.0 fL  79.3  MCH Latest Range: 26.0-34.0 pg  25.6 (L)  MCHC Latest Range: 30.0-36.0 g/dL  81.132.3  RDW Latest Range: 11.5-15.5 %  15.0  Platelets Latest Range: 150-400 K/uL  208  Glucose Latest Range: 70-99 mg/dL  914101 (H)  Total Protein, Urine No range found 11.9   Protein Creatinine Ratio Latest Range: 0.00-0.15  0.16 (H)   Creatinine, Urine No range found 73.49     Assessment and Plan  Patient is 28 y.o. [id number] 278w4d here for preeclampsia eval - fetal kick counts reinforced - preterm labor precautions - labs negative, has not had 2 elevated blood pressures in 4 hours, blood pressures reviewed from clinic and have consistently been 130s/80s.  At this time unable to diagnose gestational hypertension, discharge with preeclampsia precautions.  Perry MountACOSTA,Lesslie Mckeehan ROCIO, MD 12:37 PM   Felicia Price 12/04/2013, 2:47 PM

## 2013-12-04 NOTE — Progress Notes (Signed)
Patient complaining of daily headaches not always relieved by tylenol. She is very concerned about the possibility of preeclampsia as she had it with her previous pregnancy. BP borderline today. Will send over to MAU for further evaluation given persistent HA. FM/labor precautions reviewed

## 2013-12-04 NOTE — MAU Note (Addendum)
BP has been elevated.  Severe headaches every morning, dizziness. Now having dizziness.  Hx of pre-eclampsia with other pregnancy.  Sent from office for further eval

## 2013-12-04 NOTE — MAU Note (Signed)
Urine in lab 

## 2013-12-05 NOTE — MAU Provider Note (Signed)
Attestation of Attending Supervision of Advanced Practitioner: Evaluation and management procedures were performed by the PA/NP/CNM/OB Fellow under my supervision/collaboration. Chart reviewed and agree with management and plan. Discussed with Dr Rennis GoldenAcosta  Trejan Buda V 12/05/2013 10:03 PM

## 2013-12-11 ENCOUNTER — Ambulatory Visit (INDEPENDENT_AMBULATORY_CARE_PROVIDER_SITE_OTHER): Payer: Medicaid Other | Admitting: Obstetrics & Gynecology

## 2013-12-11 VITALS — BP 129/88 | HR 91 | Wt 186.0 lb

## 2013-12-11 DIAGNOSIS — O99713 Diseases of the skin and subcutaneous tissue complicating pregnancy, third trimester: Secondary | ICD-10-CM

## 2013-12-11 DIAGNOSIS — Z3493 Encounter for supervision of normal pregnancy, unspecified, third trimester: Secondary | ICD-10-CM

## 2013-12-11 DIAGNOSIS — O26893 Other specified pregnancy related conditions, third trimester: Secondary | ICD-10-CM

## 2013-12-11 DIAGNOSIS — L299 Pruritus, unspecified: Secondary | ICD-10-CM

## 2013-12-11 NOTE — Progress Notes (Signed)
Normal BP today. Denies any current headaches, visual symptoms, abdominal pain. Improved fetal movement perceived by patient.  Preeclampsia, labor and fetal movement precautions  Will continue to watch BP closely.  If patient has another elevated BP or other concerning symptoms, this will be an indication for IOL. If no symptoms, will manage expectantly and start postdates testing early next week.  Plan is for NST and AFI on 12/15/13; NST and OB visit on 12/18/13.  No cervical change today, still unfavorable.

## 2013-12-11 NOTE — Patient Instructions (Signed)
Return to clinic for any obstetric concerns or go to MAU for evaluation  

## 2013-12-15 ENCOUNTER — Ambulatory Visit (INDEPENDENT_AMBULATORY_CARE_PROVIDER_SITE_OTHER): Payer: Medicaid Other | Admitting: Family Medicine

## 2013-12-15 ENCOUNTER — Encounter (HOSPITAL_COMMUNITY): Payer: Self-pay

## 2013-12-15 ENCOUNTER — Inpatient Hospital Stay (HOSPITAL_COMMUNITY)
Admission: AD | Admit: 2013-12-15 | Discharge: 2013-12-17 | DRG: 775 | Disposition: A | Discharge status: Home / Self Care | Payer: Medicaid Other | Source: Ambulatory Visit | Attending: Family Medicine | Admitting: Family Medicine

## 2013-12-15 VITALS — BP 150/94

## 2013-12-15 DIAGNOSIS — O403XX0 Polyhydramnios, third trimester, not applicable or unspecified: Secondary | ICD-10-CM | POA: Diagnosis present

## 2013-12-15 DIAGNOSIS — Z9889 Other specified postprocedural states: Secondary | ICD-10-CM

## 2013-12-15 DIAGNOSIS — Z87442 Personal history of urinary calculi: Secondary | ICD-10-CM | POA: Diagnosis not present

## 2013-12-15 DIAGNOSIS — F17210 Nicotine dependence, cigarettes, uncomplicated: Secondary | ICD-10-CM | POA: Diagnosis present

## 2013-12-15 DIAGNOSIS — Z3483 Encounter for supervision of other normal pregnancy, third trimester: Secondary | ICD-10-CM

## 2013-12-15 DIAGNOSIS — Z833 Family history of diabetes mellitus: Secondary | ICD-10-CM

## 2013-12-15 DIAGNOSIS — O133 Gestational [pregnancy-induced] hypertension without significant proteinuria, third trimester: Secondary | ICD-10-CM | POA: Diagnosis present

## 2013-12-15 DIAGNOSIS — Z3A40 40 weeks gestation of pregnancy: Secondary | ICD-10-CM | POA: Diagnosis present

## 2013-12-15 DIAGNOSIS — Z3493 Encounter for supervision of normal pregnancy, unspecified, third trimester: Secondary | ICD-10-CM

## 2013-12-15 DIAGNOSIS — O3443 Maternal care for other abnormalities of cervix, third trimester: Secondary | ICD-10-CM

## 2013-12-15 DIAGNOSIS — Z8249 Family history of ischemic heart disease and other diseases of the circulatory system: Secondary | ICD-10-CM | POA: Diagnosis not present

## 2013-12-15 DIAGNOSIS — L299 Pruritus, unspecified: Secondary | ICD-10-CM

## 2013-12-15 DIAGNOSIS — O99334 Smoking (tobacco) complicating childbirth: Secondary | ICD-10-CM | POA: Diagnosis present

## 2013-12-15 DIAGNOSIS — O99713 Diseases of the skin and subcutaneous tissue complicating pregnancy, third trimester: Secondary | ICD-10-CM

## 2013-12-15 HISTORY — DX: Epidermal cyst: L72.0

## 2013-12-15 HISTORY — DX: Personal history of other infectious and parasitic diseases: Z86.19

## 2013-12-15 HISTORY — DX: Abnormal uterine and vaginal bleeding, unspecified: N93.9

## 2013-12-15 HISTORY — DX: Sleep disorder, unspecified: G47.9

## 2013-12-15 HISTORY — DX: Gestational (pregnancy-induced) hypertension without significant proteinuria, unspecified trimester: O13.9

## 2013-12-15 HISTORY — DX: Other specified behavioral and emotional disorders with onset usually occurring in childhood and adolescence: F98.8

## 2013-12-15 HISTORY — DX: Anxiety disorder, unspecified: F41.9

## 2013-12-15 HISTORY — DX: Depression, unspecified: F32.A

## 2013-12-15 HISTORY — DX: Nicotine dependence, unspecified, uncomplicated: F17.200

## 2013-12-15 HISTORY — DX: Major depressive disorder, single episode, unspecified: F32.9

## 2013-12-15 LAB — CBC
HEMATOCRIT: 35.6 % — AB (ref 36.0–46.0)
Hemoglobin: 11.6 g/dL — ABNORMAL LOW (ref 12.0–15.0)
MCH: 25.4 pg — ABNORMAL LOW (ref 26.0–34.0)
MCHC: 32.6 g/dL (ref 30.0–36.0)
MCV: 78.1 fL (ref 78.0–100.0)
Platelets: 231 10*3/uL (ref 150–400)
RBC: 4.56 MIL/uL (ref 3.87–5.11)
RDW: 15.4 % (ref 11.5–15.5)
WBC: 13.3 10*3/uL — AB (ref 4.0–10.5)

## 2013-12-15 LAB — TYPE AND SCREEN
ABO/RH(D): A POS
Antibody Screen: NEGATIVE

## 2013-12-15 MED ORDER — ONDANSETRON HCL 4 MG/2ML IJ SOLN: 4.0000 mg | Freq: Four times a day (QID) | INTRAMUSCULAR | Status: DC | PRN

## 2013-12-15 MED ORDER — ACETAMINOPHEN 325 MG PO TABS: 650.0000 mg | ORAL_TABLET | ORAL | Status: DC | PRN

## 2013-12-15 MED ORDER — LACTATED RINGERS IV SOLN
500.0000 mL | INTRAVENOUS | Status: DC | PRN
  Administered 2013-12-16: 500 mL via INTRAVENOUS

## 2013-12-15 MED ORDER — LACTATED RINGERS IV SOLN
INTRAVENOUS | Status: DC
  Administered 2013-12-15 – 2013-12-16 (×4): via INTRAVENOUS

## 2013-12-15 MED ORDER — OXYTOCIN BOLUS FROM INFUSION
500.0000 mL | INTRAVENOUS | Status: DC
  Administered 2013-12-16: 500 mL via INTRAVENOUS

## 2013-12-15 MED ORDER — OXYTOCIN 40 UNITS IN LACTATED RINGERS INFUSION - SIMPLE MED: 62.5000 mL/h | INTRAVENOUS | Status: DC

## 2013-12-15 MED ORDER — CITRIC ACID-SODIUM CITRATE 334-500 MG/5ML PO SOLN: 30.0000 mL | ORAL | Status: DC | PRN

## 2013-12-15 MED ORDER — MISOPROSTOL 25 MCG QUARTER TABLET
25.0000 ug | ORAL_TABLET | ORAL | Status: DC | PRN
  Administered 2013-12-15: 25 ug via VAGINAL
  Filled 2013-12-15: qty 0.25
  Filled 2013-12-15: qty 1

## 2013-12-15 MED ORDER — FLEET ENEMA 7-19 GM/118ML RE ENEM: 1.0000 | ENEMA | RECTAL | Status: DC | PRN

## 2013-12-15 MED ORDER — OXYCODONE-ACETAMINOPHEN 5-325 MG PO TABS: 2.0000 | ORAL_TABLET | ORAL | Status: DC | PRN

## 2013-12-15 MED ORDER — OXYCODONE-ACETAMINOPHEN 5-325 MG PO TABS: 1.0000 | ORAL_TABLET | ORAL | Status: DC | PRN

## 2013-12-15 MED ORDER — LIDOCAINE HCL (PF) 1 % IJ SOLN
30.0000 mL | INTRAMUSCULAR | Status: DC | PRN
  Filled 2013-12-15: qty 30

## 2013-12-15 MED ORDER — TERBUTALINE SULFATE 1 MG/ML IJ SOLN: 0.2500 mg | Freq: Once | INTRAMUSCULAR | Status: AC | PRN

## 2013-12-15 NOTE — Plan of Care (Signed)
Problem: Consults Goal: Birthing Suites Patient Information Press F2 to bring up selections list Outcome: Completed/Met Date Met:  12/15/13  Pt > [redacted] weeks EGA, Inpatient induction and Other (specify with a note)Gestational Hypertention & Polyhydraminos     

## 2013-12-15 NOTE — Progress Notes (Signed)
Elevated BP with polyhramnios.  [facility] and to get preeclampsia labs.

## 2013-12-15 NOTE — H&P (Signed)
Felicia Price is a 28 y.o. female 343P2002 w EGA 7237w1d [facility]  Pt plans to breast and bottle feed. Plans on vasectomy for husband.   Maternal Medical History:  Reason for admission: Nausea. Felicia Price is a 28yo [id number] w EGA of 4974w0d admitted for induction of labor due to gestational hypertension.   Fetal activity: Perceived fetal activity is normal.   Last perceived fetal movement was within the past hour.    Prenatal complications: PIH.   Prenatal Complications - Diabetes: none.    OB History   Grav Para Term Preterm Abortions TAB SAB Ect Mult Living   3 2 2       2      Past Medical History  Diagnosis Date  . Medical history non-contributory   . Gall stone   . Kidney calculi    Past Surgical History  Procedure Laterality Date  . No past surgeries     Family History: family history includes Cancer in her maternal grandfather, maternal grandmother, mother, paternal grandfather, and paternal grandmother; Diabetes in her mother; Hypertension in her father and mother. Social History:  reports that she has been smoking Cigarettes.  She has been smoking about 1.00 pack per day. She has never used smokeless tobacco. She reports that she does not drink alcohol or use illicit drugs.   Prenatal Transfer Tool  Maternal Diabetes: No Genetic Screening: Normal Maternal Ultrasounds/Referrals: Normal Fetal Ultrasounds or other Referrals:  None Maternal Substance Abuse:  No Significant Maternal Medications:  None Significant Maternal Lab Results:  None Other Comments:  History of pre-ecclampsia w 1st child, normal 2nd pregnancy.  Review of Systems  Constitutional: Negative for fever and chills.  HENT: Negative for tinnitus.   Eyes: Negative for blurred vision.  Respiratory: Negative for cough.   Cardiovascular: Negative for chest pain and palpitations.  Gastrointestinal: Negative for nausea, vomiting and diarrhea.  Genitourinary: Negative for dysuria.   Skin: Negative for rash.  Neurological: Negative for dizziness and headaches.  Psychiatric/Behavioral: Negative for depression and substance abuse.   Blood pressure 147/86, pulse 106, temperature 97.5 F (36.4 C), temperature source Oral, resp. rate 18, height 5' 5.5" (1.664 m), weight 84.369 kg (186 lb), last menstrual period 03/10/2013. Maternal Exam:  Abdomen: Fetal presentation: vertex  Cervix: Cervix evaluated by digital exam.     Fetal Exam Fetal Monitor Review: Baseline rate: 125.  Variability: moderate (6-25 bpm).   Pattern: accelerations present and no decelerations.    Fetal State Assessment: Category I - tracings are normal.     Physical Exam  Constitutional: She is oriented to person, place, and time. She appears well-developed and well-nourished.  HENT:  Head: Normocephalic.  Eyes: EOM are normal. Pupils are equal, round, and reactive to light.  Neck: No thyromegaly present.  Cardiovascular: Normal rate.   Respiratory: Effort normal.  Lymphadenopathy:    She has no cervical adenopathy.  Neurological: She is alert and oriented to person, place, and time. She has normal reflexes.  Skin: Skin is warm.  Psychiatric: She has a normal mood and affect.  Dilation: 1 Effacement (%): 50 Station: -3 Presentation: Vertex Exam by:: Dr Adriana Simasook    Prenatal labs: ABO, Rh:  O Positive  Antibody:  Pending Rubella:   RPR:   Pending HBsAg:    HIV:    GBS: Negative (09/30 0000)   Assessment/Plan: Patient is 28 y.o. [id number] 2137w1d who is [facility]  #Term gestation at 4937w1d --Admit to birthing  suites on L&D --IV access --Thin diet --Oxytocin protocol  #Gestational hypertension --Monitor for severe level pressures (above 160/105) --CMP, Pr:Cr ratio pending  #Pain --May have epidural if desired --IV pain medications as needed  #FWB --NST reactive. Category I. --Continuous fetal monitoring  #GBS negative.    Aldona BarKoch, Kari L 12/15/2013,  11:37 PM  OB fellow attestation:  I have seen and examined this patient; I agree with above documentation in the resident's note.   Felicia Price is a 28 y.o. [id number] admitted for induction of labor secondary to gestational hypertension and polyhydramnios.  +FM, denies LOF, VB, contractions, vaginal discharge.  PE: BP 147/86  Pulse 106  Temp(Src) 97.5 F (36.4 C) (Oral)  Resp 18  Ht 5' 5.5" (1.664 m)  Wt 186 lb (84.369 kg)  BMI 30.47 kg/m2  LMP 03/10/2013 Gen: calm comfortable, NAD Resp: normal effort, no distress Abd: gravid  ROS, labs, PMH reviewed NST reactive  Plan: Admit to labor and delivery for induction of labor.  #IOL 2/2 GHTN and polyhydramnios - Cytotec x1. Plan for foley bulb when able. - Monitor blood pressures.   - HELLP labs, UPC on admission.   William DaltonMcEachern, Oluwatobi Visser, MD 1:15 AM

## 2013-12-16 ENCOUNTER — Inpatient Hospital Stay (HOSPITAL_COMMUNITY): Payer: Medicaid Other | Admitting: Anesthesiology

## 2013-12-16 ENCOUNTER — Encounter (HOSPITAL_COMMUNITY): Payer: Medicaid Other | Admitting: Anesthesiology

## 2013-12-16 ENCOUNTER — Encounter (HOSPITAL_COMMUNITY): Payer: Self-pay

## 2013-12-16 DIAGNOSIS — O403XX0 Polyhydramnios, third trimester, not applicable or unspecified: Secondary | ICD-10-CM

## 2013-12-16 DIAGNOSIS — Z3A40 40 weeks gestation of pregnancy: Secondary | ICD-10-CM

## 2013-12-16 DIAGNOSIS — O1493 Unspecified pre-eclampsia, third trimester: Secondary | ICD-10-CM

## 2013-12-16 LAB — COMPREHENSIVE METABOLIC PANEL
ALBUMIN: 2.5 g/dL — AB (ref 3.5–5.2)
ALK PHOS: 150 U/L — AB (ref 39–117)
ALT: 13 U/L (ref 0–35)
ANION GAP: 13 (ref 5–15)
AST: 19 U/L (ref 0–37)
BUN: 7 mg/dL (ref 6–23)
CO2: 22 mEq/L (ref 19–32)
CREATININE: 0.53 mg/dL (ref 0.50–1.10)
Calcium: 9 mg/dL (ref 8.4–10.5)
Chloride: 100 mEq/L (ref 96–112)
GFR calc non Af Amer: >90 mL/min (ref >90)
GLUCOSE: 91 mg/dL (ref 70–99)
POTASSIUM: 4.2 meq/L (ref 3.7–5.3)
Sodium: 135 mEq/L — ABNORMAL LOW (ref 137–147)
Total Protein: 6.4 g/dL (ref 6.0–8.3)

## 2013-12-16 LAB — CBC
HCT: 36.5 % (ref 36.0–46.0)
Hemoglobin: 11.8 g/dL — ABNORMAL LOW (ref 12.0–15.0)
MCH: 25.5 pg — ABNORMAL LOW (ref 26.0–34.0)
MCHC: 32.3 g/dL (ref 30.0–36.0)
MCV: 78.8 fL (ref 78.0–100.0)
PLATELETS: 200 10*3/uL (ref 150–400)
RBC: 4.63 MIL/uL (ref 3.87–5.11)
RDW: 15.5 % (ref 11.5–15.5)
WBC: 13.6 10*3/uL — ABNORMAL HIGH (ref 4.0–10.5)

## 2013-12-16 LAB — PROTEIN / CREATININE RATIO, URINE
CREATININE, URINE: 62.19 mg/dL
Protein Creatinine Ratio: 0.2 — ABNORMAL HIGH (ref 0.00–0.15)
Total Protein, Urine: 12.3 mg/dL

## 2013-12-16 LAB — RPR

## 2013-12-16 LAB — ABO/RH: ABO/RH(D): A POS

## 2013-12-16 MED ORDER — OXYCODONE-ACETAMINOPHEN 5-325 MG PO TABS
2.0000 | ORAL_TABLET | ORAL | Status: DC | PRN
Start: 2013-12-16 — End: 2013-12-18

## 2013-12-16 MED ORDER — OXYCODONE-ACETAMINOPHEN 5-325 MG PO TABS
1.0000 | ORAL_TABLET | ORAL | Status: DC | PRN
  Administered 2013-12-17: 1 via ORAL
  Filled 2013-12-16: qty 1

## 2013-12-16 MED ORDER — FERROUS SULFATE 325 (65 FE) MG PO TABS
325.0000 mg | ORAL_TABLET | Freq: Every day | ORAL | Status: DC
  Administered 2013-12-17: 325 mg via ORAL
  Filled 2013-12-16: qty 1

## 2013-12-16 MED ORDER — PHENYLEPHRINE 40 MCG/ML (10ML) SYRINGE FOR IV PUSH (FOR BLOOD PRESSURE SUPPORT): 80.0000 ug | PREFILLED_SYRINGE | INTRAVENOUS | Status: DC | PRN

## 2013-12-16 MED ORDER — SIMETHICONE 80 MG PO CHEW: 80.0000 mg | CHEWABLE_TABLET | ORAL | Status: DC | PRN

## 2013-12-16 MED ORDER — TERBUTALINE SULFATE 1 MG/ML IJ SOLN: 0.2500 mg | Freq: Once | INTRAMUSCULAR | Status: DC | PRN

## 2013-12-16 MED ORDER — ONDANSETRON HCL 4 MG/2ML IJ SOLN: 4.0000 mg | INTRAMUSCULAR | Status: DC | PRN

## 2013-12-16 MED ORDER — DIBUCAINE 1 % RE OINT: 1.0000 "application " | TOPICAL_OINTMENT | RECTAL | Status: DC | PRN

## 2013-12-16 MED ORDER — EPHEDRINE 5 MG/ML INJ: 10.0000 mg | INTRAVENOUS | Status: DC | PRN

## 2013-12-16 MED ORDER — OXYTOCIN 40 UNITS IN LACTATED RINGERS INFUSION - SIMPLE MED
1.0000 m[IU]/min | INTRAVENOUS | Status: DC
  Administered 2013-12-16: 2 m[IU]/min via INTRAVENOUS
  Filled 2013-12-16: qty 1000

## 2013-12-16 MED ORDER — PRENATAL MULTIVITAMIN CH
1.0000 | ORAL_TABLET | Freq: Every day | ORAL | Status: DC
  Administered 2013-12-17: 1 via ORAL
  Filled 2013-12-16: qty 1

## 2013-12-16 MED ORDER — LACTATED RINGERS IV SOLN: 500.0000 mL | Freq: Once | INTRAVENOUS | Status: DC

## 2013-12-16 MED ORDER — FENTANYL 2.5 MCG/ML BUPIVACAINE 1/10 % EPIDURAL INFUSION (WH - ANES)
INTRAMUSCULAR | Status: DC | PRN
  Administered 2013-12-16: 14 mL/h via EPIDURAL

## 2013-12-16 MED ORDER — WITCH HAZEL-GLYCERIN EX PADS: 1.0000 "application " | MEDICATED_PAD | CUTANEOUS | Status: DC | PRN

## 2013-12-16 MED ORDER — PNEUMOCOCCAL VAC POLYVALENT 25 MCG/0.5ML IJ INJ
0.5000 mL | INJECTION | INTRAMUSCULAR | Status: DC
Start: 2013-12-17 — End: 2013-12-18
  Filled 2013-12-16: qty 0.5

## 2013-12-16 MED ORDER — FENTANYL 2.5 MCG/ML BUPIVACAINE 1/10 % EPIDURAL INFUSION (WH - ANES): 14.0000 mL/h | INTRAMUSCULAR | Status: DC | PRN

## 2013-12-16 MED ORDER — FENTANYL 2.5 MCG/ML BUPIVACAINE 1/10 % EPIDURAL INFUSION (WH - ANES)
INTRAMUSCULAR | Status: AC
  Administered 2013-12-16: 16:00:00
  Filled 2013-12-16: qty 125

## 2013-12-16 MED ORDER — SENNOSIDES-DOCUSATE SODIUM 8.6-50 MG PO TABS: 2.0000 | ORAL_TABLET | ORAL | Status: DC

## 2013-12-16 MED ORDER — PHENYLEPHRINE 40 MCG/ML (10ML) SYRINGE FOR IV PUSH (FOR BLOOD PRESSURE SUPPORT)
PREFILLED_SYRINGE | INTRAVENOUS | Status: AC
  Filled 2013-12-16: qty 10

## 2013-12-16 MED ORDER — ZOLPIDEM TARTRATE 5 MG PO TABS: 5.0000 mg | ORAL_TABLET | Freq: Every evening | ORAL | Status: DC | PRN

## 2013-12-16 MED ORDER — LANOLIN HYDROUS EX OINT: TOPICAL_OINTMENT | CUTANEOUS | Status: DC | PRN

## 2013-12-16 MED ORDER — ONDANSETRON HCL 4 MG PO TABS: 4.0000 mg | ORAL_TABLET | ORAL | Status: DC | PRN

## 2013-12-16 MED ORDER — TETANUS-DIPHTH-ACELL PERTUSSIS 5-2.5-18.5 LF-MCG/0.5 IM SUSP: 0.5000 mL | Freq: Once | INTRAMUSCULAR | Status: DC

## 2013-12-16 MED ORDER — DIPHENHYDRAMINE HCL 50 MG/ML IJ SOLN: 12.5000 mg | INTRAMUSCULAR | Status: DC | PRN

## 2013-12-16 MED ORDER — DIPHENHYDRAMINE HCL 25 MG PO CAPS: 25.0000 mg | ORAL_CAPSULE | Freq: Four times a day (QID) | ORAL | Status: DC | PRN

## 2013-12-16 MED ORDER — BENZOCAINE-MENTHOL 20-0.5 % EX AERO: 1.0000 "application " | INHALATION_SPRAY | CUTANEOUS | Status: DC | PRN

## 2013-12-16 MED ORDER — LIDOCAINE HCL (PF) 1 % IJ SOLN
INTRAMUSCULAR | Status: DC | PRN
  Administered 2013-12-16: 4 mL
  Administered 2013-12-16: 6 mL

## 2013-12-16 MED ORDER — INFLUENZA VAC SPLIT QUAD 0.5 ML IM SUSY
0.5000 mL | PREFILLED_SYRINGE | INTRAMUSCULAR | Status: AC
  Administered 2013-12-17: 0.5 mL via INTRAMUSCULAR
  Filled 2013-12-16: qty 0.5

## 2013-12-16 MED ORDER — IBUPROFEN 600 MG PO TABS
600.0000 mg | ORAL_TABLET | Freq: Four times a day (QID) | ORAL | Status: DC
  Administered 2013-12-16 – 2013-12-17 (×4): 600 mg via ORAL
  Filled 2013-12-16 (×4): qty 1

## 2013-12-16 MED ORDER — ZOLPIDEM TARTRATE 5 MG PO TABS
5.0000 mg | ORAL_TABLET | Freq: Once | ORAL | Status: AC
  Administered 2013-12-16: 5 mg via ORAL
  Filled 2013-12-16: qty 1

## 2013-12-16 NOTE — Anesthesia Preprocedure Evaluation (Signed)
Anesthesia Evaluation  Patient identified by MRN, date of birth, ID band Patient awake    Reviewed: Allergy & Precautions, H&P , Patient's Chart, lab work & pertinent test results  Airway Mallampati: II  TM Distance: >3 FB Neck ROM: full    Dental  (+) Teeth Intact   Pulmonary Current Smoker,  breath sounds clear to auscultation        Cardiovascular hypertension, Rhythm:regular Rate:Normal     Neuro/Psych    GI/Hepatic   Endo/Other    Renal/GU      Musculoskeletal   Abdominal   Peds  Hematology   Anesthesia Other Findings       Reproductive/Obstetrics (+) Pregnancy                             Anesthesia Physical Anesthesia Plan  ASA: II  Anesthesia Plan: Epidural   Post-op Pain Management:    Induction:   Airway Management Planned:   Additional Equipment:   Intra-op Plan:   Post-operative Plan:   Informed Consent: I have reviewed the patients History and Physical, chart, labs and discussed the procedure including the risks, benefits and alternatives for the proposed anesthesia with the patient or authorized representative who has indicated his/her understanding and acceptance.   Dental Advisory Given  Plan Discussed with:   Anesthesia Plan Comments: (Labs checked- platelets confirmed with RN in room. Fetal heart tracing, per RN, reported to be stable enough for sitting procedure. Discussed epidural, and patient consents to the procedure:  included risk of possible headache,backache, failed block, allergic reaction, and nerve injury. This patient was asked if she had any questions or concerns before the procedure started.)        Anesthesia Quick Evaluation

## 2013-12-16 NOTE — Progress Notes (Signed)
LABOR PROGRESS NOTE  Felicia BruinsJessica Price is a 28 y.o. G3P2002 at 6833w1d  admitted for induction of labor due to gestational hypertension, polyhydramnios.  Subjective: Comfortable. Foley bulb fell out while urinating.   Objective: BP 133/63  Pulse 74  Temp(Src) 97.5 F (36.4 C) (Oral)  Resp 18  Ht 5' 5.5" (1.664 m)  Wt 186 lb (84.369 kg)  BMI 30.47 kg/m2  LMP 03/10/2013 or  Filed Vitals:   12/15/13 2220 12/15/13 2225 12/16/13 0332  BP: 147/86  133/63  Pulse: 106  74  Temp: 97.5 F (36.4 C)  97.5 F (36.4 C)  TempSrc: Oral  Oral  Resp: 18  18  Height:  5' 5.5" (1.664 m)   Weight:  186 lb (84.369 kg)        FHT:  FHR: 120 bpm, variability: moderate,  accelerations:  Present,  decelerations:  Absent UC:   irregular SVE:   Dilation: 1 Effacement (%): 50 Station: -3 Exam by:: Dr Adriana Simasook  Dilation: 1 Effacement (%): 50 Station: -3 Presentation: Vertex Exam by:: Dr Adriana Simasook  Labs: Lab Results  Component Value Date   WBC 13.3* 12/15/2013   HGB 11.6* 12/15/2013   HCT 35.6* 12/15/2013   MCV 78.1 12/15/2013   PLT 231 12/15/2013    Assessment / Plan: Induction of labor 2/2 GHTN, polyhydramnios.  Labor: Staged induction. Status post cytotec x 2. Foley bulb felt out and only 2 cm dilated, replaced manually at 0545 without complaints. Plan to start pitocin once foley bulb out. Fetal Wellbeing:  Category I Pain Control:  Epidural once more active Anticipated MOD:  NSVD  Felicia Price, Felicia Mumaw, MD 12/16/2013, 5:44 AM

## 2013-12-16 NOTE — Progress Notes (Signed)
Pt moving frequently--adjusting cardio as needed

## 2013-12-16 NOTE — Anesthesia Procedure Notes (Signed)

## 2013-12-16 NOTE — Progress Notes (Signed)
Felicia Price is a 28 y.o. G3P2002 at 4271w1d  admitted for induction of labor due to Hydramnios and Hypertension.  Subjective: Doing well with epidural  Objective: BP 102/49  Pulse 73  Temp(Src) 98.4 F (36.9 C) (Oral)  Resp 18  Ht 5' 5.5" (1.664 m)  Wt 186 lb (84.369 kg)  BMI 30.47 kg/m2  SpO2 94%  LMP 03/10/2013      FHT:  FHR: 130 bpm, variability: moderate,  accelerations:  Present,  decelerations:  Absent UC:   regular, every 2-3 minutes SVE:   Dilation: 5 Effacement (%): 70 Station: -2 Exam by:: Dr.Golda Zavalza  Labs: Lab Results  Component Value Date   WBC 13.6* 12/16/2013   HGB 11.8* 12/16/2013   HCT 36.5 12/16/2013   MCV 78.8 12/16/2013   PLT 200 12/16/2013    Assessment / Plan: Induction of labor due to gestational hypertension,  progressing well on pitocin  Labor: Progressing normally - AROM with copious amount of clear fluid Preeclampsia:  labs stable Fetal Wellbeing:  Category I Pain Control:  Epidural I/D:  n/a Anticipated MOD:  NSVD  Felicia Price 12/16/2013, 6:36 PM

## 2013-12-16 NOTE — Progress Notes (Signed)
LABOR PROGRESS NOTE  Felicia Price is a 28 y.o. G3P2002 at 1258w1d  admitted for induction of labor due to gestational hypertension, polyhydramnios.  Subjective: Comfortable. Foley bulb fell out while urinating.   Objective: BP 140/77  Pulse 74  Temp(Src) 97.2 F (36.2 C) (Axillary)  Resp 18  Ht 5' 5.5" (1.664 m)  Wt 84.369 kg (186 lb)  BMI 30.47 kg/m2  LMP 03/10/2013 or  Filed Vitals:   12/16/13 1030 12/16/13 1100 12/16/13 1130 12/16/13 1201  BP: 119/60 123/75 134/71 140/77  Pulse: 86 64 72 74  Temp:    97.2 F (36.2 C)  TempSrc:    Axillary  Resp: 18 18 18 18   Height:      Weight:           FHT:  FHR: 125 bpm, variability: moderate,  accelerations:  Present,  decelerations:  Absent UC:   irregular SVE:   Dilation: 4.5 Effacement (%): 60 Cervical Position: Middle Station: -2 Presentation: Vertex Exam by:: Dr.Acosta  Labs: Lab Results  Component Value Date   WBC 13.3* 12/15/2013   HGB 11.6* 12/15/2013   HCT 35.6* 12/15/2013   MCV 78.1 12/15/2013   PLT 231 12/15/2013    Assessment / Plan: Induction of labor 2/2 GHTN, polyhydramnios.  Labor: IOL. Foley bulb out, on pitocin, plan amniotomy after epidural placement Fetal Wellbeing:  Category I Pain Control:  Epidural once more active Anticipated MOD:  NSVD  Jacquiline DoeParker, Caleb, MD 12/16/2013, 1:48 PM

## 2013-12-16 NOTE — Progress Notes (Signed)
Felicia Price is a 28 y.o. G3P2002 at 7368w1d by ultrasound admitted for induction of labor due to Hypertension.  Subjective:   Objective: BP 147/86  Pulse 106  Temp(Src) 97.5 F (36.4 C) (Oral)  Resp 18  Ht 5' 5.5" (1.664 m)  Wt 84.369 kg (186 lb)  BMI 30.47 kg/m2  LMP 03/10/2013      FHT:  FHR: 145 bpm, variability: moderate,  accelerations:  Present,  decelerations:  Absent UC:   none SVE:   Dilation: 1 Effacement (%): 50 Station: -3 Exam by:: e. poore, rn  Labs: Lab Results  Component Value Date   WBC 13.3* 12/15/2013   HGB 11.6* 12/15/2013   HCT 35.6* 12/15/2013   MCV 78.1 12/15/2013   PLT 231 12/15/2013    Assessment / Plan: Induction of labor due to gestational hypertension,  progressing well on pitocin  Labor: Progressing normally Preeclampsia:  no signs or symptoms of toxicity Fetal Wellbeing:  Category I Pain Control:  Labor support without medications I/D:  n/a Anticipated MOD:  NSVD  Foloey bulb placed at 0400.  Felicia Price, Felicia Price 12/16/2013, 4:01 AM

## 2013-12-16 NOTE — H&P (Signed)
Attestation of Attending Supervision of Advanced Practitioner (CNM/NP): Evaluation and management procedures were performed by the Advanced Practitioner under my supervision and collaboration.  I have reviewed the Advanced Practitioner's note and chart, and I agree with the management and plan.  Damaree Sargent 12/16/2013 7:05 AM

## 2013-12-17 LAB — CBC
HEMATOCRIT: 31.2 % — AB (ref 36.0–46.0)
HEMOGLOBIN: 10 g/dL — AB (ref 12.0–15.0)
MCH: 25.3 pg — ABNORMAL LOW (ref 26.0–34.0)
MCHC: 32.1 g/dL (ref 30.0–36.0)
MCV: 79 fL (ref 78.0–100.0)
Platelets: 202 10*3/uL (ref 150–400)
RBC: 3.95 MIL/uL (ref 3.87–5.11)
RDW: 15.4 % (ref 11.5–15.5)
WBC: 13.7 10*3/uL — ABNORMAL HIGH (ref 4.0–10.5)

## 2013-12-17 NOTE — Progress Notes (Signed)
CSW followed-up with the MOB prior to discharge in order to provide her with a list of mental health resources for therapy and medication management.  MOB and MGM expressed appreciation for the information.   The MOB stated that she continues to be doing "well" and is eager to be discharged.   No barriers to discharge.  

## 2013-12-17 NOTE — Discharge Summary (Signed)
Obstetric Discharge Summary Reason for Admission: induction of labor Prenatal Procedures: none Intrapartum Procedures: spontaneous vaginal delivery Postpartum Procedures: none Complications-Operative and Postpartum: none Hemoglobin  Date Value Ref Range Status  12/16/2013 11.8* 12.0 - 15.0 g/dL Final     HCT  Date Value Ref Range Status  12/16/2013 36.5  36.0 - 46.0 % Final    Physical Exam:  General: alert, cooperative and no distress Lochia: appropriate Uterine Fundus: firm DVT Evaluation: No evidence of DVT seen on physical exam.  Discharge Diagnoses: Term Pregnancy-delivered  Discharge Information: Date: 12/17/2013 Activity: pelvic rest Diet: routine Medications: Ibuprofen Condition: stable Instructions: refer to practice specific booklet Discharge to: home   Newborn Data: Live born female  Birth Weight: 9 lb 2.6 oz (4155 g) APGAR: 8, 9  Home with mother.  Aldona BarKoch, Kari L 12/17/2013, 6:15 AM  See also by me Agree with note Aviva SignsMarie L Lindzey Zent, CNM

## 2013-12-17 NOTE — Anesthesia Postprocedure Evaluation (Signed)
  Anesthesia Post-op Note  Patient: Marcelyn BruinsJessica Fantini  Procedure(s) Performed: * No procedures listed *  Patient Location: PACU and Mother/Baby  Anesthesia Type:Epidural  Level of Consciousness: awake, alert  and oriented  Airway and Oxygen Therapy: Patient Spontanous Breathing  Post-op Pain: mild  Post-op Assessment: Patient's Cardiovascular Status Stable, Respiratory Function Stable, No signs of Nausea or vomiting, Adequate PO intake, Pain level controlled, No headache, No backache, No residual numbness and No residual motor weakness  Post-op Vital Signs: Reviewed and stable  Last Vitals:  Filed Vitals:   12/17/13 0504  BP:   Pulse:   Temp: 36.8 C  Resp:     Complications: No apparent anesthesia complications

## 2013-12-17 NOTE — Discharge Summary (Signed)
Attestation of Attending Supervision of Advanced Practitioner (PA/CNM/NP): Evaluation and management procedures were performed by the Advanced Practitioner under my supervision and collaboration.  I have reviewed the Advanced Practitioner's note and chart, and I agree with the management and plan.  Jessey Huyett, DO Attending Physician Faculty Practice, [facility]  

## 2013-12-18 ENCOUNTER — Encounter: Payer: Medicaid Other | Admitting: Obstetrics and Gynecology

## 2013-12-18 NOTE — Progress Notes (Signed)
Post discharge chart review completed.  

## 2013-12-23 ENCOUNTER — Encounter (HOSPITAL_COMMUNITY): Payer: Self-pay

## 2013-12-25 ENCOUNTER — Encounter: Payer: Self-pay | Admitting: Family Medicine

## 2013-12-25 ENCOUNTER — Encounter: Payer: Medicaid Other | Admitting: Family Medicine

## 2013-12-25 ENCOUNTER — Ambulatory Visit (INDEPENDENT_AMBULATORY_CARE_PROVIDER_SITE_OTHER): Payer: Medicaid Other | Admitting: Family Medicine

## 2013-12-25 DIAGNOSIS — F32A Depression, unspecified: Secondary | ICD-10-CM

## 2013-12-25 DIAGNOSIS — F329 Major depressive disorder, single episode, unspecified: Secondary | ICD-10-CM

## 2013-12-25 MED ORDER — VENLAFAXINE HCL ER 75 MG PO CP24: 75.0000 mg | ORAL_CAPSULE | Freq: Every day | ORAL | Status: DC

## 2013-12-25 NOTE — Progress Notes (Signed)
Patient is feeling like she is having post partum depression and she is also having a lump in her vaginal area that she is concerned about, she also feels like she has a yeast infection.

## 2013-12-25 NOTE — Patient Instructions (Signed)
Postpartum Depression and Baby Blues The postpartum period begins right after the birth of a baby. During this time, there is often a great amount of joy and excitement. It is also a time of many changes in the life of the parents. Regardless of how many times a mother gives birth, each child brings new challenges and dynamics to the family. It is not unusual to have feelings of excitement along with confusing shifts in moods, emotions, and thoughts. All mothers are at risk of developing postpartum depression or the "baby blues." These mood changes can occur right after giving birth, or they may occur many months after giving birth. The baby blues or postpartum depression can be mild or severe. Additionally, postpartum depression can go away rather quickly, or it can be a long-term condition.  CAUSES Raised hormone levels and the rapid drop in those levels are thought to be a main cause of postpartum depression and the baby blues. A number of hormones change during and after pregnancy. Estrogen and progesterone usually decrease right after the delivery of your baby. The levels of thyroid hormone and various cortisol steroids also rapidly drop. Other factors that play a role in these mood changes include major life events and genetics.  RISK FACTORS If you have any of the following risks for the baby blues or postpartum depression, know what symptoms to watch out for during the postpartum period. Risk factors that may increase the likelihood of getting the baby blues or postpartum depression include:  Having a personal or family history of depression.   Having depression while being pregnant.   Having premenstrual mood issues or mood issues related to oral contraceptives.  Having a lot of life stress.   Having marital conflict.   Lacking a social support network.   Having a baby with special needs.   Having health problems, such as diabetes.  SIGNS AND SYMPTOMS Symptoms of baby blues  include:  Brief changes in mood, such as going from extreme happiness to sadness.  Decreased concentration.   Difficulty sleeping.   Crying spells, tearfulness.   Irritability.   Anxiety.  Symptoms of postpartum depression typically begin within the first month after giving birth. These symptoms include:  Difficulty sleeping or excessive sleepiness.   Marked weight loss.   Agitation.   Feelings of worthlessness.   Lack of interest in activity or food.  Postpartum psychosis is a very serious condition and can be dangerous. Fortunately, it is rare. Displaying any of the following symptoms is cause for immediate medical attention. Symptoms of postpartum psychosis include:   Hallucinations and delusions.   Bizarre or disorganized behavior.   Confusion or disorientation.  DIAGNOSIS  A diagnosis is made by an evaluation of your symptoms. There are no medical or lab tests that lead to a diagnosis, but there are various questionnaires that a health care provider may use to identify those with the baby blues, postpartum depression, or psychosis. Often, a screening tool called the Edinburgh Postnatal Depression Scale is used to diagnose depression in the postpartum period.  TREATMENT The baby blues usually goes away on its own in 1-2 weeks. Social support is often all that is needed. You will be encouraged to get adequate sleep and rest. Occasionally, you may be given medicines to help you sleep.  Postpartum depression requires treatment because it can last several months or longer if it is not treated. Treatment may include individual or group therapy, medicine, or both to address any social, physiological, and psychological   factors that may play a role in the depression. Regular exercise, a healthy diet, rest, and social support may also be strongly recommended.  Postpartum psychosis is more serious and needs treatment right away. Hospitalization is often needed. HOME CARE  INSTRUCTIONS  Get as much rest as you can. Nap when the baby sleeps.   Exercise regularly. Some women find yoga and walking to be beneficial.   Eat a balanced and nourishing diet.   Do little things that you enjoy. Have a cup of tea, take a bubble bath, read your favorite magazine, or listen to your favorite music.  Avoid alcohol.   Ask for help with household chores, cooking, grocery shopping, or running errands as needed. Do not try to do everything.   Talk to people close to you about how you are feeling. Get support from your partner, family members, friends, or other new moms.  Try to stay positive in how you think. Think about the things you are grateful for.   Do not spend a lot of time alone.   Only take over-the-counter or prescription medicine as directed by your health care provider.  Keep all your postpartum appointments.   Let your health care provider know if you have any concerns.  SEEK MEDICAL CARE IF: You are having a reaction to or problems with your medicine. SEEK IMMEDIATE MEDICAL CARE IF:  You have suicidal feelings.   You think you may harm the baby or someone else. MAKE SURE YOU:  Understand these instructions.  Will watch your condition.  Will get help right away if you are not doing well or get worse. Document Released: 11/11/2003 Document Revised: 02/11/2013 Document Reviewed: 11/18/2012 ExitCare Patient Information 2015 [employer]. This information is not intended to replace advice given to you by your health care provider. Make sure you discuss any questions you have with your health care provider.  

## 2013-12-25 NOTE — Progress Notes (Signed)
    Subjective:    Patient ID: Felicia Price is a 28 y.o. female presenting with Postpartum Care  on 12/25/2013  HPI: Reports feeling sad since 2 days pp.  Everything is hard to do.  She is crying about daily tasks.  She has no h/o pp depression.  Has started having panic attacks. Reports a lot of change and additional stress in her life. Has h/o depression and has been on Prozac previously--self weaned because medicine made her feel bad.  Also has lump in vagina.  Review of Systems  Constitutional: Negative for fever and chills.  Respiratory: Negative for shortness of breath.   Cardiovascular: Negative for chest pain.  Gastrointestinal: Negative for nausea, vomiting and abdominal pain.  Genitourinary: Negative for dysuria.  Skin: Negative for rash.  Psychiatric/Behavioral: Negative for suicidal ideas and hallucinations.      Objective:    BP 123/97 mmHg  Pulse 83  Ht 5\' 7"  (1.702 m)  Wt 158 lb 9.6 oz (71.94 kg)  BMI 24.83 kg/m2  LMP 03/10/2013  Breastfeeding? No Physical Exam  Constitutional: She is oriented to person, place, and time. She appears well-developed and well-nourished. No distress.  HENT:  Head: Normocephalic and atraumatic.  Eyes: No scleral icterus.  Neck: Neck supple.  Cardiovascular: Normal rate.   Pulmonary/Chest: Effort normal.  Abdominal: Soft.  Genitourinary:  There is a firm lump on left side of vagina  Neurological: She is alert and oriented to person, place, and time.  Skin: Skin is warm and dry.  Psychiatric: She has a normal mood and affect.   Procedure: After cleaning with alcohol, used 22g needle to aspirate, blood noted     Assessment & Plan:   Problem List Items Addressed This Visit      Unprioritized   Clinical depression   Relevant Medications      venlafaxine (EFFEXOR-XR) 24 hr capsule    Other Visit Diagnoses    Perineal hematoma during delivery    -  Primary       Return in about 4 weeks (around 01/22/2014).

## 2014-01-27 ENCOUNTER — Ambulatory Visit: Payer: Medicaid Other | Admitting: Family Medicine

## 2014-01-28 ENCOUNTER — Other Ambulatory Visit: Payer: Self-pay | Admitting: Family Medicine

## 2014-03-03 ENCOUNTER — Ambulatory Visit: Payer: Medicaid Other | Admitting: Family Medicine

## 2014-03-12 ENCOUNTER — Ambulatory Visit (INDEPENDENT_AMBULATORY_CARE_PROVIDER_SITE_OTHER): Payer: Self-pay | Admitting: Obstetrics & Gynecology

## 2014-03-12 ENCOUNTER — Encounter: Payer: Self-pay | Admitting: Obstetrics & Gynecology

## 2014-03-12 VITALS — BP 130/82 | HR 75 | Wt 144.0 lb

## 2014-03-12 DIAGNOSIS — Z309 Encounter for contraceptive management, unspecified: Secondary | ICD-10-CM

## 2014-03-12 DIAGNOSIS — Z30013 Encounter for initial prescription of injectable contraceptive: Secondary | ICD-10-CM

## 2014-03-12 DIAGNOSIS — Z3042 Encounter for surveillance of injectable contraceptive: Secondary | ICD-10-CM

## 2014-03-12 MED ORDER — MEDROXYPROGESTERONE ACETATE 150 MG/ML IM SUSP
150.0000 mg | INTRAMUSCULAR | Status: DC
  Administered 2014-03-12: 150 mg via INTRAMUSCULAR

## 2014-03-12 NOTE — Progress Notes (Signed)
   CLINIC ENCOUNTER NOTE  History:  29 y.o. [id number] here today for contraception counseling. Had baby 3 months ago, husband planning to get vasectomy but has not scheduled it yet.  Patient had GHTN during pregnancy, BP 130/82 today, smokes 1 ppd of cigarettes.  No other concerns.  The following portions of the patient's history were reviewed and updated as appropriate: allergies, current medications, past family history, past medical history, past social history, past surgical history and problem list. [facility]    Review of Systems:  A comprehensive review of systems was negative.  Objective:   BP 130/82 mmHg  Pulse 75  Wt 144 lb (65.318 kg)  LMP 02/10/2014  Breastfeeding? No Physical Exam deferred  Assessment & Plan:  Patient is not a good candidate for estrogen given borderline HTN and smoking 1 ppd.  Reviewed all forms of birth control options available including abstinence; over the counter/barrier methods; progesterone only methods including pill, injection,contraceptive implant; hormonal and nonhormonal IUDs; permanent sterilization options for her.  Risks and benefits reviewed.  Questions were answered. Patient opted for Depo Provera for now, injection given.  Will return in 3 months for second injection; husband will schedule vasectomy in meantime.   Routine preventative health maintenance measures emphasized.   Jaynie CollinsUGONNA  Jarissa Sheriff, MD, FACOG Attending Obstetrician & Gynecologist Center for [facility], [facility]

## 2014-03-12 NOTE — Patient Instructions (Signed)
Return to clinic for any scheduled appointments or for any gynecologic concerns as needed.   

## 2014-05-19 ENCOUNTER — Telehealth: Payer: Self-pay | Admitting: *Deleted

## 2014-05-19 MED ORDER — NORGESTIMATE-ETH ESTRADIOL 0.25-35 MG-MCG PO TABS: 1.0000 | ORAL_TABLET | Freq: Every day | ORAL | Status: DC

## 2014-05-19 MED ORDER — NORETHINDRONE ACET-ETHINYL EST 1-20 MG-MCG PO TABS: 1.0000 | ORAL_TABLET | Freq: Every day | ORAL | Status: DC

## 2014-05-19 NOTE — Telephone Encounter (Signed)
Patient is having continuous bleeding with the Depo Provera and would like to switch back to taking ocp.

## 2014-05-19 NOTE — Telephone Encounter (Signed)
Left message for patient to call me back regarding the birth control pill option.  Dr. Macon LargeAnyanwu would like to change ocp to Lo-estrin fe 20mcg.  Due to patient is a smoker and the increased risk of dvt blood clot and pe.  Patient understands these risks and will try to stop smoking but she still would like to take the lower dose ocp in the meantime.  Her husband has considered a vasectomy but this appointment has been getting put off.  She is also considering getting her tubes tied.  She is going to discuss this again with her husband and give us a call back.  She is leaning toward getting her tubes ties so she does not need to worry about the hormone issues and smoking.

## 2014-06-11 ENCOUNTER — Ambulatory Visit: Payer: Self-pay

## 2014-07-20 ENCOUNTER — Other Ambulatory Visit: Payer: Self-pay | Admitting: Obstetrics & Gynecology

## 2014-07-21 ENCOUNTER — Encounter: Payer: Self-pay | Admitting: Family Medicine

## 2014-07-21 ENCOUNTER — Ambulatory Visit (INDEPENDENT_AMBULATORY_CARE_PROVIDER_SITE_OTHER): Payer: Medicaid Other | Admitting: Family Medicine

## 2014-07-21 VITALS — BP 140/83 | HR 66 | Ht 67.0 in | Wt 150.6 lb

## 2014-07-21 DIAGNOSIS — Z3009 Encounter for other general counseling and advice on contraception: Secondary | ICD-10-CM

## 2014-07-21 DIAGNOSIS — Z302 Encounter for sterilization: Secondary | ICD-10-CM

## 2014-07-21 NOTE — Progress Notes (Signed)
    Subjective:    Patient ID: Felicia Price is a 29 y.o. female presenting with surgery consult  on 07/21/2014  HPI: She is 8 months pp from 3rd vaginal delivery.  Has tried Depo and OC's. Has decided she does not want more children and desires permanent sterilization.  Review of Systems  Constitutional: Negative for fever and chills.  Respiratory: Negative for shortness of breath.   Cardiovascular: Negative for chest pain.  Gastrointestinal: Negative for nausea, vomiting and abdominal pain.  Genitourinary: Negative for dysuria.  Skin: Negative for rash.      Objective:    BP 140/83 mmHg  Pulse 66  Ht 5\' 7"  (1.702 m)  Wt 150 lb 9.6 oz (68.312 kg)  BMI 23.58 kg/m2  LMP 07/21/2014 (Exact Date)  Breastfeeding? No Physical Exam  Constitutional: She is oriented to person, place, and time. She appears well-developed and well-nourished. No distress.  HENT:  Head: Normocephalic and atraumatic.  Eyes: No scleral icterus.  Neck: Neck supple.  Cardiovascular: Normal rate.   Pulmonary/Chest: Effort normal.  Abdominal: Soft.  Neurological: She is alert and oriented to person, place, and time.  Skin: Skin is warm and dry.  Psychiatric: She has a normal mood and affect.        Assessment & Plan:   Request for sterilization - 30 d papers today. Risks and benefits of permanent sterilization reviewed in detail. Risks of surgery discussed also. Will schedule laparoscopic salpingectomy.   Return in about 6 weeks (around 09/01/2014) for postop check.  PRATT,TANYA S 07/21/2014 11:14 AM

## 2014-07-21 NOTE — Progress Notes (Signed)
Patient would like to discuss having her tubes tied.

## 2014-07-21 NOTE — Patient Instructions (Signed)
Laparoscopic Tubal Ligation Laparoscopic tubal ligation is a procedure that closes the fallopian tubes at a time other than right after childbirth. By closing the fallopian tubes, the eggs that are released from the ovaries cannot enter the uterus and sperm cannot reach the egg. Tubal ligation is also known as getting your "tubes tied." Tubal ligation is done so you will not be able to get pregnant or have a baby.  Although this procedure may be reversed, it should be considered permanent and irreversible. If you want to have future pregnancies, you should not have this procedure.  LET YOUR CAREGIVER KNOW ABOUT:  Allergies to food or medicine.  Medicines taken, including vitamins, herbs, eyedrops, over-the-counter medicines, and creams.  Use of steroids (by mouth or creams).  Previous problems with numbing medicines.  History of bleeding problems or blood clots.  Any recent colds or infections.  Previous surgery.  Other health problems, including diabetes and kidney problems.  Possibility of pregnancy, if this applies.  Any past pregnancies. RISKS AND COMPLICATIONS   Infection.  Bleeding.  Injury to surrounding organs.  Anesthetic side effects.  Failure of the procedure.  Ectopic pregnancy.  Future regret about having the procedure done. BEFORE THE PROCEDURE  Do not take aspirin or blood thinners a week before the procedure or as directed. This can cause bleeding.  Do not eat or drink anything 6 to 8 hours before the procedure. PROCEDURE   You may be given a medicine to help you relax (sedative) before the procedure. You will be given a medicine to make you sleep (general anesthetic) during the procedure.  A tube will be put down your throat to help your breath while under general anesthesia.  Two small cuts (incisions) are made in the lower abdominal area and near the belly button.  Your abdominal area will be inflated with a safe gas (carbon dioxide). This helps  give the surgeon room to operate, visualize, and helps the surgeon avoid other organs.  A thin, lighted tube (laparoscope) with a camera attached is inserted into your abdomen through one of the incisions near the belly button. Other small instruments are also inserted through the other abdominal incision.  The fallopian tubes are located and are either blocked with a ring, clip, or are burned (cauterized).  After the fallopian tubes are blocked, the gas is released from the abdomen.  The incisions will be closed with stitches (sutures), and a bandage may be placed over the incisions. AFTER THE PROCEDURE   You will rest in a recovery room for 1--4 hours until you are stable and doing well.  You will also have some mild abdominal discomfort for 3--7 days. You will be given pain medicine to ease any discomfort.  As long as there are no problems, you may be allowed to go home. Someone will need to drive you home and be with you for at least 24 hours once home.  You may have some mild discomfort in the throat. This is from the tube placed in your throat while you were sleeping.  You may experience discomfort in the shoulder area from some trapped air between the liver and diaphragm. This sensation is normal and will slowly go away on its own. Document Released: 05/15/2000 Document Revised: 08/08/2011 Document Reviewed: 05/20/2011 Exit[employer] Patient Information 2015 [employer]. This information is not intended to replace advice given to you by your health care provider. Make sure you discuss any questions you have with your health care provider.  

## 2014-07-22 ENCOUNTER — Encounter (HOSPITAL_COMMUNITY): Payer: Self-pay | Admitting: *Deleted

## 2014-07-23 ENCOUNTER — Telehealth: Payer: Self-pay | Admitting: *Deleted

## 2014-07-23 DIAGNOSIS — Z3041 Encounter for surveillance of contraceptive pills: Secondary | ICD-10-CM

## 2014-07-23 MED ORDER — NORETHINDRONE ACET-ETHINYL EST 1-20 MG-MCG PO TABS: 1.0000 | ORAL_TABLET | Freq: Every day | ORAL | Status: DC

## 2014-07-23 NOTE — Telephone Encounter (Signed)
Patient called and needed refills on her birth control. I have sent in refills on her medicine

## 2014-08-13 ENCOUNTER — Other Ambulatory Visit: Payer: Self-pay | Admitting: Obstetrics & Gynecology

## 2014-08-20 ENCOUNTER — Encounter (HOSPITAL_COMMUNITY): Payer: Self-pay | Admitting: *Deleted

## 2014-08-28 DIAGNOSIS — Z302 Encounter for sterilization: Secondary | ICD-10-CM

## 2014-08-28 NOTE — H&P (Signed)
  Felicia Price is an 29 y.o. [phone] Unknown female.   Chief Complaint: Multiparity and undesired fertility HPI: Pt. Desires permanent sterilization.  Past Medical History  Diagnosis Date  . Smoker   . Gall stone   . Kidney calculi   . Gestational hypertension   . ADD (attention deficit disorder)   . Depression   . Anxiety   . Sleep disorder   . Abnormal uterine bleeding   . Epidermal cyst of face   . H/O chlamydia infection     Past Surgical History  Procedure Laterality Date  . Leep    . Wisdom tooth extraction      as 29 year old    Family History  Problem Relation Age of Onset  . Cancer Mother     skin  . Diabetes Mother   . Hypertension Mother   . Hypertension Father   . Cancer Maternal Grandmother     breast  . Cancer Maternal Grandfather     lung  . Cancer Paternal Grandmother     lung  . Cancer Paternal Grandfather     lung   Social History:  reports that she has been smoking Cigarettes.  She has been smoking about 1.00 pack per day. She has never used smokeless tobacco. She reports that she drinks alcohol. She reports that she does not use illicit drugs.  Allergies:  Allergies  Allergen Reactions  . Cefaclor Other (See Comments)    Query joint inflammation    No current facility-administered medications on file prior to encounter.   Current Outpatient Prescriptions on File Prior to Encounter  Medication Sig Dispense Refill  . amphetamine-dextroamphetamine (ADDERALL XR) 30 MG 24 hr capsule Take one capsule daily-open capsule and sprinkle in applesauce not chew beads    . sertraline (ZOLOFT) 100 MG tablet Take one tablet daily      Pertinent items are noted in HPI.  Height 5\' 7"  (1.702 m), weight 157 lb (71.215 kg), last menstrual period 08/10/2014, not currently breastfeeding. General appearance: alert, cooperative and appears stated age Head: Normocephalic, without obvious abnormality, atraumatic Neck: supple, symmetrical, trachea  midline Lungs: normal effort Heart: regular rate and rhythm Abdomen: soft, non-tender; bowel sounds normal; no masses,  no organomegaly Extremities: extremities normal, atraumatic, no cyanosis or edema Skin: Skin color, texture, turgor normal. No rashes or lesions Neurologic: Grossly normal   Lab Results  Component Value Date   WBC 13.7* 12/17/2013   HGB 10.0* 12/17/2013   HCT 31.2* 12/17/2013   MCV 79.0 12/17/2013   PLT 202 12/17/2013     Assessment/Plan Principal Problem:   Encounter for sterilization  Risks include but are not limited to bleeding, infection, injury to surrounding structures, including bowel, bladder and ureters, blood clots, and death.  Likelihood of success is high. Patient counseled, r.e. Risks benefits of BTL, including permanency of procedure, risk of failure(1:100), increased risk of ectopic.  Patient verbalized understanding and desires to proceed.    Felicia Price S 08/28/2014, 1:43 AM

## 2014-09-01 ENCOUNTER — Other Ambulatory Visit: Payer: Self-pay | Admitting: Obstetrics & Gynecology

## 2014-09-14 NOTE — Anesthesia Preprocedure Evaluation (Addendum)
Anesthesia Evaluation  Patient identified by MRN, date of birth, ID band Patient awake    Reviewed: Allergy & Precautions, H&P , Patient's Chart, lab work & pertinent test results, reviewed documented beta blocker date and time   Airway Mallampati: II  TM Distance: >3 FB Neck ROM: full    Dental no notable dental hx.    Pulmonary Current Smoker,  breath sounds clear to auscultation  Pulmonary exam normal       Cardiovascular Rhythm:regular Rate:Normal     Neuro/Psych    GI/Hepatic   Endo/Other    Renal/GU      Musculoskeletal   Abdominal   Peds  Hematology   Anesthesia Other Findings   Reproductive/Obstetrics                             Anesthesia Physical Anesthesia Plan  ASA: II  Anesthesia Plan: General   Post-op Pain Management:    Induction: Intravenous  Airway Management Planned: Oral ETT  Additional Equipment:   Intra-op Plan:   Post-operative Plan: Extubation in OR  Informed Consent: I have reviewed the patients History and Physical, chart, labs and discussed the procedure including the risks, benefits and alternatives for the proposed anesthesia with the patient or authorized representative who has indicated his/her understanding and acceptance.   Dental Advisory Given and Dental advisory given  Plan Discussed with: CRNA and Surgeon  Anesthesia Plan Comments: (  Discussed general anesthesia, including possible nausea, instrumentation of airway, sore throat,pulmonary aspiration, etc. I asked if the were any outstanding questions, or  concerns before we proceeded. )        Anesthesia Quick Evaluation  

## 2014-09-15 ENCOUNTER — Encounter (HOSPITAL_COMMUNITY)
Admission: RE | Disposition: A | Discharge status: Home / Self Care | Payer: Self-pay | Source: Ambulatory Visit | Attending: Family Medicine

## 2014-09-15 ENCOUNTER — Encounter (HOSPITAL_COMMUNITY): Payer: Self-pay

## 2014-09-15 ENCOUNTER — Ambulatory Visit (HOSPITAL_COMMUNITY): Payer: Medicaid Other | Admitting: Anesthesiology

## 2014-09-15 ENCOUNTER — Ambulatory Visit (HOSPITAL_COMMUNITY)
Admission: RE | Admit: 2014-09-15 | Discharge: 2014-09-15 | Disposition: A | Discharge status: Home / Self Care | Payer: Medicaid Other | Source: Ambulatory Visit | Attending: Family Medicine | Admitting: Family Medicine

## 2014-09-15 DIAGNOSIS — F419 Anxiety disorder, unspecified: Secondary | ICD-10-CM | POA: Insufficient documentation

## 2014-09-15 DIAGNOSIS — Z302 Encounter for sterilization: Secondary | ICD-10-CM | POA: Diagnosis present

## 2014-09-15 DIAGNOSIS — F17210 Nicotine dependence, cigarettes, uncomplicated: Secondary | ICD-10-CM | POA: Insufficient documentation

## 2014-09-15 DIAGNOSIS — F329 Major depressive disorder, single episode, unspecified: Secondary | ICD-10-CM | POA: Insufficient documentation

## 2014-09-15 DIAGNOSIS — F988 Other specified behavioral and emotional disorders with onset usually occurring in childhood and adolescence: Secondary | ICD-10-CM | POA: Diagnosis not present

## 2014-09-15 HISTORY — PX: LAPAROSCOPIC BILATERAL SALPINGECTOMY: SHX5889

## 2014-09-15 LAB — CBC
HCT: 41.1 % (ref 36.0–46.0)
Hemoglobin: 13 g/dL (ref 12.0–15.0)
MCH: 24.1 pg — AB (ref 26.0–34.0)
MCHC: 31.6 g/dL (ref 30.0–36.0)
MCV: 76.1 fL — AB (ref 78.0–100.0)
Platelets: 298 10*3/uL (ref 150–400)
RBC: 5.4 MIL/uL — AB (ref 3.87–5.11)
RDW: 16 % — ABNORMAL HIGH (ref 11.5–15.5)
WBC: 6.4 10*3/uL (ref 4.0–10.5)

## 2014-09-15 LAB — PREGNANCY, URINE: Preg Test, Ur: NEGATIVE

## 2014-09-15 SURGERY — SALPINGECTOMY, BILATERAL, LAPAROSCOPIC
Anesthesia: General | Site: Abdomen | Laterality: Bilateral

## 2014-09-15 MED ORDER — OXYCODONE-ACETAMINOPHEN 5-325 MG PO TABS
1.0000 | ORAL_TABLET | Freq: Once | ORAL | Status: AC
  Administered 2014-09-15: 1 via ORAL

## 2014-09-15 MED ORDER — LACTATED RINGERS IV SOLN
INTRAVENOUS | Status: DC
  Administered 2014-09-15: 12:00:00 via INTRAVENOUS

## 2014-09-15 MED ORDER — FENTANYL CITRATE (PF) 100 MCG/2ML IJ SOLN
INTRAMUSCULAR | Status: DC | PRN
  Administered 2014-09-15: 25 ug via INTRAVENOUS
  Administered 2014-09-15 (×2): 50 ug via INTRAVENOUS
  Administered 2014-09-15: 25 ug via INTRAVENOUS
  Administered 2014-09-15: 100 ug via INTRAVENOUS

## 2014-09-15 MED ORDER — ONDANSETRON HCL 4 MG/2ML IJ SOLN
INTRAMUSCULAR | Status: DC | PRN
  Administered 2014-09-15: 4 mg via INTRAVENOUS

## 2014-09-15 MED ORDER — LACTATED RINGERS IV SOLN
INTRAVENOUS | Status: DC
  Administered 2014-09-15 (×2): via INTRAVENOUS

## 2014-09-15 MED ORDER — ROCURONIUM BROMIDE 100 MG/10ML IV SOLN
INTRAVENOUS | Status: AC
  Filled 2014-09-15: qty 1

## 2014-09-15 MED ORDER — ROCURONIUM BROMIDE 100 MG/10ML IV SOLN
INTRAVENOUS | Status: DC | PRN
  Administered 2014-09-15: 30 mg via INTRAVENOUS
  Administered 2014-09-15: 10 mg via INTRAVENOUS

## 2014-09-15 MED ORDER — ACETAMINOPHEN 160 MG/5ML PO SOLN
975.0000 mg | Freq: Once | ORAL | Status: AC
  Administered 2014-09-15: 975 mg via ORAL

## 2014-09-15 MED ORDER — ONDANSETRON HCL 4 MG/2ML IJ SOLN
INTRAMUSCULAR | Status: AC
  Filled 2014-09-15: qty 2

## 2014-09-15 MED ORDER — DEXAMETHASONE SODIUM PHOSPHATE 4 MG/ML IJ SOLN
INTRAMUSCULAR | Status: AC
  Filled 2014-09-15: qty 1

## 2014-09-15 MED ORDER — SCOPOLAMINE 1 MG/3DAYS TD PT72
1.0000 | MEDICATED_PATCH | Freq: Once | TRANSDERMAL | Status: DC
  Administered 2014-09-15: 1.5 mg via TRANSDERMAL

## 2014-09-15 MED ORDER — NEOSTIGMINE METHYLSULFATE 10 MG/10ML IV SOLN
INTRAVENOUS | Status: DC | PRN
  Administered 2014-09-15: 1 mg via INTRAVENOUS
  Administered 2014-09-15: 2 mg via INTRAVENOUS

## 2014-09-15 MED ORDER — BUPIVACAINE HCL (PF) 0.25 % IJ SOLN
INTRAMUSCULAR | Status: AC
  Filled 2014-09-15: qty 30

## 2014-09-15 MED ORDER — OXYCODONE-ACETAMINOPHEN 5-325 MG PO TABS
ORAL_TABLET | ORAL | Status: AC
  Filled 2014-09-15: qty 1

## 2014-09-15 MED ORDER — PROPOFOL 10 MG/ML IV BOLUS
INTRAVENOUS | Status: DC | PRN
  Administered 2014-09-15: 80 mg via INTRAVENOUS

## 2014-09-15 MED ORDER — MIDAZOLAM HCL 2 MG/2ML IJ SOLN
INTRAMUSCULAR | Status: DC | PRN
  Administered 2014-09-15: 2 mg via INTRAVENOUS

## 2014-09-15 MED ORDER — OXYCODONE-ACETAMINOPHEN 5-325 MG PO TABS: 1.0000 | ORAL_TABLET | Freq: Four times a day (QID) | ORAL | Status: AC | PRN

## 2014-09-15 MED ORDER — LIDOCAINE HCL (CARDIAC) 20 MG/ML IV SOLN
INTRAVENOUS | Status: AC
Start: 2014-09-15 — End: 2014-09-15
  Filled 2014-09-15: qty 5

## 2014-09-15 MED ORDER — GLYCOPYRROLATE 0.2 MG/ML IJ SOLN
INTRAMUSCULAR | Status: DC | PRN
  Administered 2014-09-15: 0.2 mg via INTRAVENOUS
  Administered 2014-09-15: 0.4 mg via INTRAVENOUS

## 2014-09-15 MED ORDER — PROPOFOL 10 MG/ML IV BOLUS
INTRAVENOUS | Status: AC
  Filled 2014-09-15: qty 20

## 2014-09-15 MED ORDER — KETOROLAC TROMETHAMINE 30 MG/ML IJ SOLN
INTRAMUSCULAR | Status: DC | PRN
  Administered 2014-09-15: 30 mg via INTRAVENOUS

## 2014-09-15 MED ORDER — SCOPOLAMINE 1 MG/3DAYS TD PT72
MEDICATED_PATCH | TRANSDERMAL | Status: AC
  Administered 2014-09-15: 1.5 mg via TRANSDERMAL
  Filled 2014-09-15: qty 1

## 2014-09-15 MED ORDER — FENTANYL CITRATE (PF) 250 MCG/5ML IJ SOLN
INTRAMUSCULAR | Status: AC
  Filled 2014-09-15: qty 5

## 2014-09-15 MED ORDER — FENTANYL CITRATE (PF) 100 MCG/2ML IJ SOLN
INTRAMUSCULAR | Status: AC
  Filled 2014-09-15: qty 2

## 2014-09-15 MED ORDER — DEXAMETHASONE SODIUM PHOSPHATE 10 MG/ML IJ SOLN
INTRAMUSCULAR | Status: DC | PRN
Start: 2014-09-15 — End: 2014-09-15
  Administered 2014-09-15: 4 mg via INTRAVENOUS

## 2014-09-15 MED ORDER — ACETAMINOPHEN 160 MG/5ML PO SOLN
ORAL | Status: AC
  Administered 2014-09-15: 975 mg via ORAL
  Filled 2014-09-15: qty 40.6

## 2014-09-15 MED ORDER — FENTANYL CITRATE (PF) 100 MCG/2ML IJ SOLN
25.0000 ug | INTRAMUSCULAR | Status: DC | PRN
  Administered 2014-09-15: 50 ug via INTRAVENOUS
  Administered 2014-09-15: 25 ug via INTRAVENOUS

## 2014-09-15 MED ORDER — BUPIVACAINE HCL (PF) 0.25 % IJ SOLN
INTRAMUSCULAR | Status: DC | PRN
  Administered 2014-09-15: 9 mL

## 2014-09-15 MED ORDER — STERILE WATER FOR IRRIGATION IR SOLN
Status: DC | PRN
  Administered 2014-09-15: 1000 mL

## 2014-09-15 MED ORDER — MIDAZOLAM HCL 2 MG/2ML IJ SOLN
INTRAMUSCULAR | Status: AC
  Filled 2014-09-15: qty 2

## 2014-09-15 SURGICAL SUPPLY — 32 items
BLADE SURG 15 STRL LF C SS BP (BLADE) ×1 IMPLANT
BLADE SURG 15 STRL SS (BLADE) ×2
CABLE HIGH FREQUENCY MONO STRZ (ELECTRODE) IMPLANT
CHLORAPREP W/TINT 26ML (MISCELLANEOUS) ×3 IMPLANT
CLOTH BEACON ORANGE TIMEOUT ST (SAFETY) ×3 IMPLANT
DRSG COVADERM PLUS 2X2 (GAUZE/BANDAGES/DRESSINGS) ×3 IMPLANT
DRSG OPSITE POSTOP 3X4 (GAUZE/BANDAGES/DRESSINGS) ×3 IMPLANT
FORCEPS CUTTING 33CM 5MM (CUTTING FORCEPS) IMPLANT
GLOVE BIOGEL PI IND STRL 7.0 (GLOVE) ×1 IMPLANT
GLOVE BIOGEL PI INDICATOR 7.0 (GLOVE) ×2
GLOVE ECLIPSE 7.0 STRL STRAW (GLOVE) ×6 IMPLANT
GOWN STRL REUS W/TWL LRG LVL3 (GOWN DISPOSABLE) ×9 IMPLANT
LIQUID BAND (GAUZE/BANDAGES/DRESSINGS) ×3 IMPLANT
NS IRRIG 1000ML POUR BTL (IV SOLUTION) ×3 IMPLANT
PACK LAPAROSCOPY BASIN (CUSTOM PROCEDURE TRAY) ×3 IMPLANT
PAD POSITIONER PINK NONSTERILE (MISCELLANEOUS) ×3 IMPLANT
POUCH SPECIMEN RETRIEVAL 10MM (ENDOMECHANICALS) IMPLANT
PROTECTOR NERVE ULNAR (MISCELLANEOUS) ×3 IMPLANT
SET IRRIG TUBING LAPAROSCOPIC (IRRIGATION / IRRIGATOR) IMPLANT
SHEARS HARMONIC ACE PLUS 36CM (ENDOMECHANICALS) IMPLANT
SUT VIC AB 3-0 X1 27 (SUTURE) ×3 IMPLANT
SUT VICRYL 0 UR6 27IN ABS (SUTURE) ×6 IMPLANT
SUT VICRYL 4-0 PS2 18IN ABS (SUTURE) ×3 IMPLANT
TOWEL OR 17X24 6PK STRL BLUE (TOWEL DISPOSABLE) ×6 IMPLANT
TRAY FOLEY CATH SILVER 14FR (SET/KITS/TRAYS/PACK) ×3 IMPLANT
TROCAR BALL TOP DISP 5MM (ENDOMECHANICALS) ×6 IMPLANT
TROCAR BALLN 12MMX100 BLUNT (TROCAR) ×3 IMPLANT
TROCAR XCEL DIL TIP R 11M (ENDOMECHANICALS) IMPLANT
TROCAR XCEL NON-BLD 11X100MML (ENDOMECHANICALS) ×3 IMPLANT
TROCAR XCEL NON-BLD 5MMX100MML (ENDOMECHANICALS) ×6 IMPLANT
WARMER LAPAROSCOPE (MISCELLANEOUS) ×3 IMPLANT
WATER STERILE IRR 1000ML POUR (IV SOLUTION) ×3 IMPLANT

## 2014-09-15 NOTE — Transfer of Care (Signed)
Immediate Anesthesia Transfer of Care Note  Patient: Felicia Price  Procedure(s) Performed: Procedure(s): LAPAROSCOPIC BILATERAL SALPINGECTOMY (Bilateral)  Patient Location: PACU  Anesthesia Type:General  Level of Consciousness: awake  Airway & Oxygen Therapy: Patient Spontanous Breathing  Post-op Assessment: Report given to PACU RN  Post vital signs: stable  Filed Vitals:   09/15/14 1149  BP: 119/74  Pulse:   Temp:   Resp:     Complications: No apparent anesthesia complications

## 2014-09-15 NOTE — Anesthesia Procedure Notes (Signed)
Procedure Name: Intubation Date/Time: 09/15/2014 12:59 PM Performed by: Cephus Shelling A Pre-anesthesia Checklist: Patient identified, Emergency Drugs available, Suction available and Patient being monitored Patient Re-evaluated:Patient Re-evaluated prior to inductionOxygen Delivery Method: Circle system utilized and Simple face mask Preoxygenation: Pre-oxygenation with 100% oxygen Intubation Type: IV induction Ventilation: Mask ventilation without difficulty Laryngoscope Size: Mac and 3 Grade View: Grade II Tube type: Oral Tube size: 7.0 mm Number of attempts: 1 Airway Equipment and Method: Stylet Placement Confirmation: ETT inserted through vocal cords under direct vision,  positive ETCO2 and breath sounds checked- equal and bilateral Secured at: 20 (right lip) cm Tube secured with: Tape Dental Injury: Teeth and Oropharynx as per pre-operative assessment

## 2014-09-15 NOTE — Discharge Instructions (Signed)

## 2014-09-15 NOTE — Interval H&P Note (Signed)
History and Physical Interval Note:  09/15/2014 12:23 PM  Felicia Price  has presented today for surgery, with the diagnosis of  Undesired fertility  The various methods of treatment have been discussed with the patient and family. After consideration of risks, benefits and other options for treatment, the patient has consented to  Procedure(s): LAPAROSCOPIC BILATERAL SALPINGECTOMY (Bilateral) as a surgical intervention. Patient counseled, r.e. Risks benefits of BTL, including permanency of procedure, risk of failure(1:100), increased risk of ectopic.  Patient verbalized understanding and desires to proceed .  The patient's history has been reviewed, patient examined, no change in status, stable for surgery.  I have reviewed the patient's chart and labs.  Questions were answered to the patient's satisfaction.     Felicia Price S

## 2014-09-15 NOTE — Anesthesia Postprocedure Evaluation (Signed)
Anesthesia Post Note  Patient: Felicia Price  Procedure(s) Performed: Procedure(s) (LRB): LAPAROSCOPIC BILATERAL SALPINGECTOMY (Bilateral)  Anesthesia type: General  Patient location: PACU  Post pain: Pain level controlled  Post assessment: Post-op Vital signs reviewed  Last Vitals:  Filed Vitals:   09/15/14 1445  BP: 116/67  Pulse: 58  Temp:   Resp: 14    Post vital signs: Reviewed  Level of consciousness: sedated  Complications: No apparent anesthesia complications

## 2014-09-15 NOTE — Op Note (Signed)
PROCEDURE DATE: 09/15/2014  PREOPERATIVE DIAGNOSES: Undesired fertility  POSTOPERATIVE DIAGNOSES: The same   PROCEDURE: Laparoscopic bilateral salpingctomy   SURGEON: Dr. Reva Bores   ASSISTANT: None  ANESTHESIOLOGIST: Cristela Blue, MD MD - GETT  INDICATIONS: 29 y.o. [phone] who desired permanent sterilization.  FINDINGS: Normal appearing uterus, tubes and ovaries, small area of endometriosis noted   ESTIMATED BLOOD LOSS: 25 ml   SPECIMENS: Bilateral fallopian tubes to pathology  COMPLICATIONS: None immediately known   PROCEDURE IN DETAIL: The patient had sequential compression devices applied to her lower extremities while in the preoperative area. She was then taken to the operating room where general anesthesia was administered and was found to be adequate. She was placed in the dorsal lithotomy position, and was prepped and draped in a sterile manner. A foley catheter was inserted into her bladder. A speculum was placed inside the vagina, and the cervix grasped with an single tooth tenaculum. A Hulka tenaculum was placed through the cervix for uterine manipulation. Attention was then turned to the patient's abdomen where a 11-mm skin incision was made in the umbilicus.  This was carried down to the underlying fascia and peritoneum.  The fascia was tagged with 0 Vicryl suture on a UR-6. Intraperitoneal placement was confirmed and insufflation done. A survey of the patient's pelvis and abdomen revealed the findings above. Two left sided 5-mm lower quadrant ports were then placed under direct visualization. On the right side, the tube was separated from the mesosalpinx with the Harmonic device.  On the leftt side, the tube was separated from the mesosalpinx with the Harmonic device.  The specimen was then removed from the abdomen through the 11-mm port, under direct visualization. The operative site was surveyed, and found to be hemostatic. No intraoperative injury to other surrounding  organs was noted. The abdomen was desufflated and all instruments were then removed from the patient's abdomen. Fascial closure was completed with aforementioned 0 Vicryl on a UR-6. All skin incisions were closed with 3-0 Vicryl subcuticular stitches/Dermabond.   Reva Bores MD 09/15/2014 2:05 PM

## 2014-09-16 ENCOUNTER — Encounter (HOSPITAL_COMMUNITY): Payer: Self-pay | Admitting: Family Medicine

## 2014-09-17 ENCOUNTER — Telehealth: Payer: Self-pay | Admitting: *Deleted

## 2014-09-17 NOTE — Telephone Encounter (Signed)
Received message left on nurse line on 09/17/14 at 10 am.  Patient states she had a BTL on Tuesday and has some questions regarding the amount of swelling and bleeding she is having since the procedure.  The patient is a patient of the [facility] office.  Will forward to their office workqueue.

## 2014-09-18 ENCOUNTER — Telehealth: Payer: Self-pay | Admitting: *Deleted

## 2014-09-18 NOTE — Telephone Encounter (Signed)
Pt called stating she has had excessive vaginal bleeding since Tubal on Tuesday. Pt filling 5-6 pads each day. No clotting, no cramping, no dizziness or feeling light headed. Pt was instructed to adv if pain increases, becomes severely light headed, bleeding worsens, bleeding or spotting continues over 7 days.

## 2014-09-28 ENCOUNTER — Telehealth: Payer: Self-pay | Admitting: *Deleted

## 2014-09-28 NOTE — Telephone Encounter (Signed)
Pt called stating that she is having lower abdominal discomfort and pressure and feels it could be related to a bladder infection, pt also had BTL done 2 weeks ago.  Scheduled nurse visit for UA and possible Urine cx on 8/9 @ 1100.

## 2014-09-29 ENCOUNTER — Other Ambulatory Visit (INDEPENDENT_AMBULATORY_CARE_PROVIDER_SITE_OTHER): Payer: Self-pay | Admitting: *Deleted

## 2014-09-29 DIAGNOSIS — R309 Painful micturition, unspecified: Secondary | ICD-10-CM

## 2014-09-29 LAB — POCT URINALYSIS DIPSTICK
BILIRUBIN UA: NEGATIVE
Blood, UA: NEGATIVE
Glucose, UA: NEGATIVE
Ketones, UA: NEGATIVE
Leukocytes, UA: NEGATIVE
NITRITE UA: NEGATIVE
PH UA: 6.5
Protein, UA: NEGATIVE
SPEC GRAV UA: 1.01
Urobilinogen, UA: NEGATIVE

## 2014-09-29 NOTE — Progress Notes (Signed)
Patient is here today to drop off a urine specimen.  I will send urine off for culture.

## 2014-09-30 LAB — URINE CULTURE
Colony Count: NO GROWTH
ORGANISM ID, BACTERIA: NO GROWTH

## 2014-10-01 ENCOUNTER — Ambulatory Visit (INDEPENDENT_AMBULATORY_CARE_PROVIDER_SITE_OTHER): Payer: Self-pay | Admitting: Family Medicine

## 2014-10-01 ENCOUNTER — Ambulatory Visit: Payer: Medicaid Other | Admitting: Family Medicine

## 2014-10-01 ENCOUNTER — Encounter: Payer: Self-pay | Admitting: Family Medicine

## 2014-10-01 VITALS — BP 122/81 | HR 86 | Ht 67.0 in | Wt 148.8 lb

## 2014-10-01 DIAGNOSIS — Z9889 Other specified postprocedural states: Secondary | ICD-10-CM

## 2014-10-01 DIAGNOSIS — N946 Dysmenorrhea, unspecified: Secondary | ICD-10-CM

## 2014-10-01 DIAGNOSIS — Z09 Encounter for follow-up examination after completed treatment for conditions other than malignant neoplasm: Secondary | ICD-10-CM

## 2014-10-01 MED ORDER — IBUPROFEN 800 MG PO TABS: 800.0000 mg | ORAL_TABLET | Freq: Three times a day (TID) | ORAL | Status: DC | PRN

## 2014-10-01 NOTE — Patient Instructions (Signed)

## 2014-10-01 NOTE — Progress Notes (Signed)
    Subjective:    Patient ID: Felicia Price is a 29 y.o. female presenting with Routine Post Op  on 10/01/2014  HPI: Reports some lower abdominal pressure. Reports some pressure and aggravating feeling.  Came in for eval of UTI, but culture was negative. Surgical pathology is reviewed and normal. Reports significant pain with cycles.  Review of Systems  Constitutional: Negative for fever and chills.  Gastrointestinal: Negative for nausea, vomiting, abdominal pain and diarrhea.  Genitourinary: Positive for pelvic pain. Negative for dysuria.      Objective:    BP 122/81 mmHg  Pulse 86  Ht  (1.702 m)  Wt 148 lb 12.8 oz (67.495 kg)  BMI 23.30 kg/m2  LMP 09/03/2014 (Approximate)  Breastfeeding? No Physical Exam  Constitutional: She is oriented to person, place, and time. She appears well-developed and well-nourished. No distress.  HENT:  Head: Normocephalic and atraumatic.  Eyes: No scleral icterus.  Neck: Neck supple.  Cardiovascular: Normal rate.   Pulmonary/Chest: Effort normal.  Abdominal: Soft.  Neurological: She is alert and oriented to person, place, and time.  Skin: Skin is warm and dry.  Incisions are clean and dry  Psychiatric: She has a normal mood and affect.        Assessment & Plan:   Problem List Items Addressed This Visit      Unprioritized   Dysmenorrhea    Have written her an rx for 800 mg Ibuprofen prn symtpoms.  Take with food.      Relevant Medications   ibuprofen (ADVIL,MOTRIN) 800 MG tablet    Other Visit Diagnoses    Postop check    -  Primary        Courtlyn Aki S 10/01/2014 11:42 AM

## 2014-10-01 NOTE — Assessment & Plan Note (Signed)
Have written her an rx for 800 mg Ibuprofen prn symtpoms.  Take with food.

## 2014-10-08 ENCOUNTER — Encounter: Payer: Self-pay | Admitting: *Deleted

## 2014-11-05 ENCOUNTER — Telehealth: Payer: Self-pay | Admitting: *Deleted

## 2014-11-05 DIAGNOSIS — N939 Abnormal uterine and vaginal bleeding, unspecified: Secondary | ICD-10-CM

## 2014-11-05 MED ORDER — NORGESTIMATE-ETH ESTRADIOL 0.25-35 MG-MCG PO TABS: 1.0000 | ORAL_TABLET | Freq: Every day | ORAL | Status: AC

## 2014-11-05 NOTE — Telephone Encounter (Signed)
Pt called, had a BTL two months ago, c/o irregular bleeding and cramping with lower back pain on and off for the last few weeks.  Informed pt that she may need to go back on a birth control pill to help with irregular menstrual cycles, pt is gonna start a birth control pill and will take them continuously to see if that will help with her bleeding.  Discussed case with Dr Marice Potter, will call in [facility].  She will make an appt for the end of October for her annual physical exam and will discuss with physician at that visit how things have been going since starting the pill.

## 2014-12-10 ENCOUNTER — Telehealth: Payer: Self-pay

## 2014-12-10 NOTE — Telephone Encounter (Signed)
Called Felicia Price to notify her that her PCP would not auth a visit with us on 12/15/2014, because she does not have established care with them. [facility] and she states that she made an appointment with them to establish care and never came. Due to this they will not give out a NPI# for her upcoming appointment with us. If she chooses to keep her appointment with us she will be "self pay".

## 2014-12-15 ENCOUNTER — Ambulatory Visit: Payer: Medicaid Other | Admitting: Family Medicine

## 2015-01-04 ENCOUNTER — Other Ambulatory Visit: Payer: Self-pay | Admitting: Family Medicine

## 2015-10-26 IMAGING — US US ABDOMEN COMPLETE
1 series · 14 of 25 positions shown · non-contrast
Comparison: None.

CLINICAL DATA: Epigastric pain.

EXAM:
ULTRASOUND ABDOMEN COMPLETE

[Series 1: us abdomen complete · 14 of 93 slices shown]
[im 1/93]
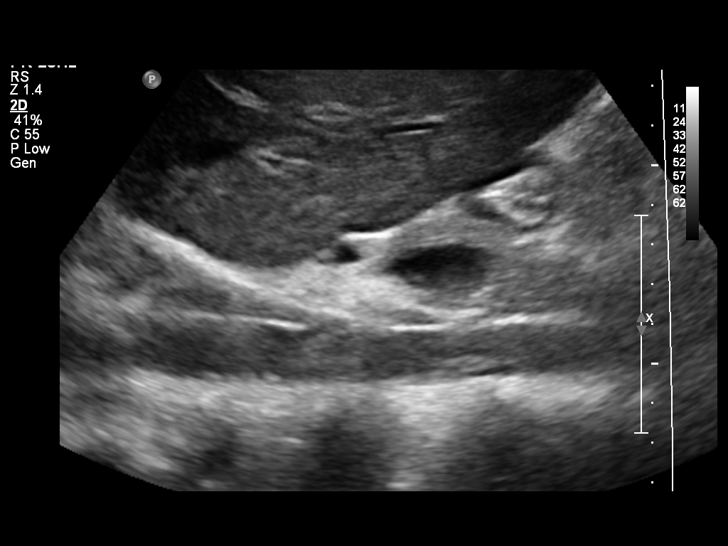
[im 8/93]
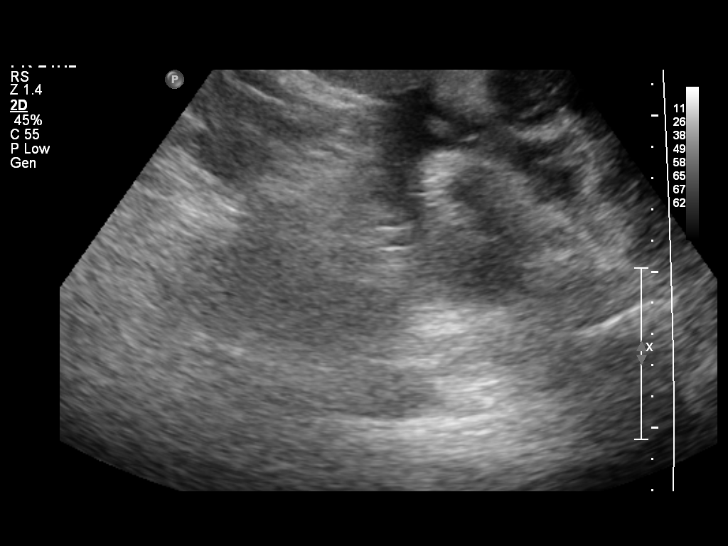
[im 16/93]
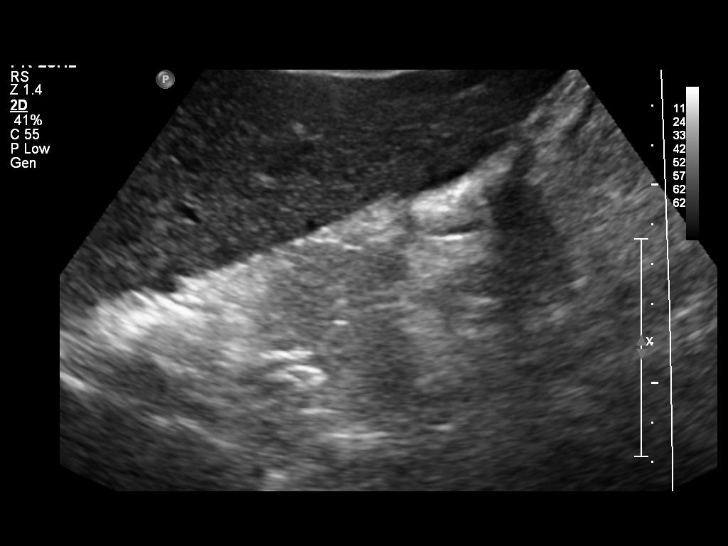
[im 24/93]
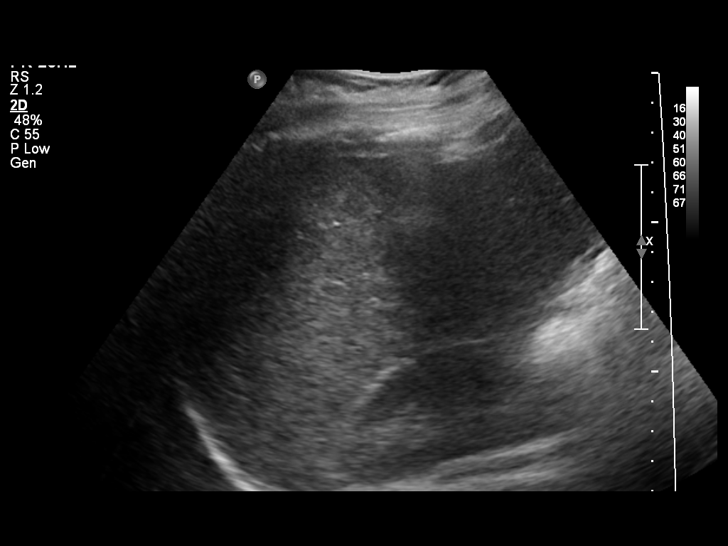
[im 31/93]
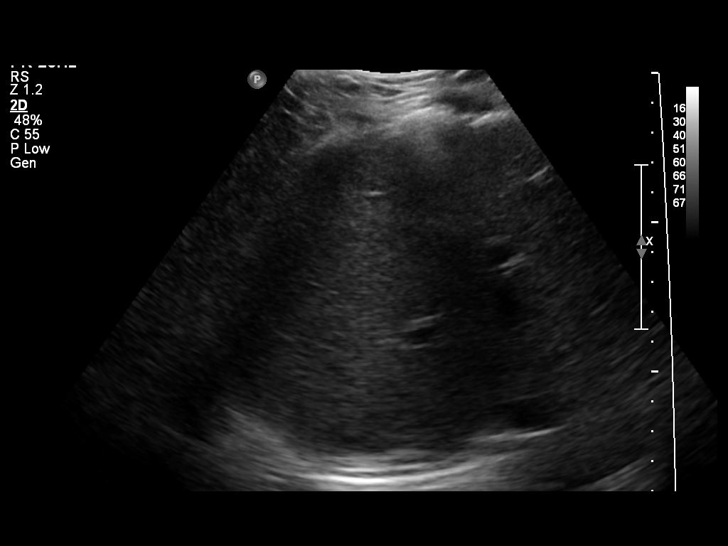
[im 35/93]
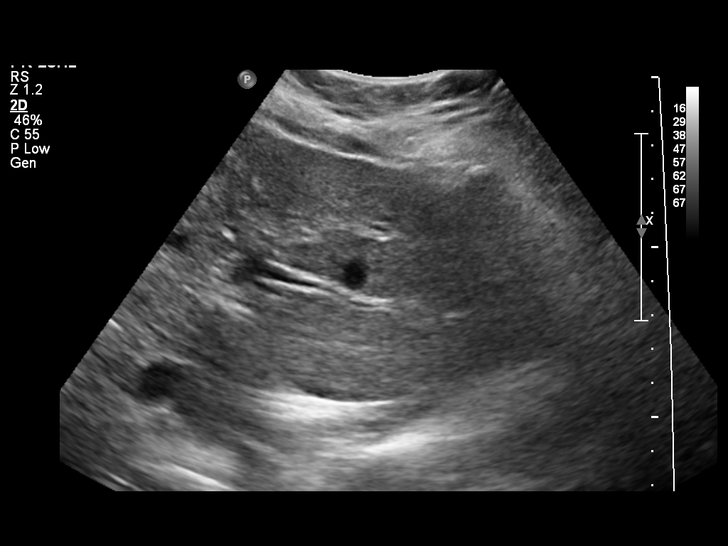
[im 43/93]
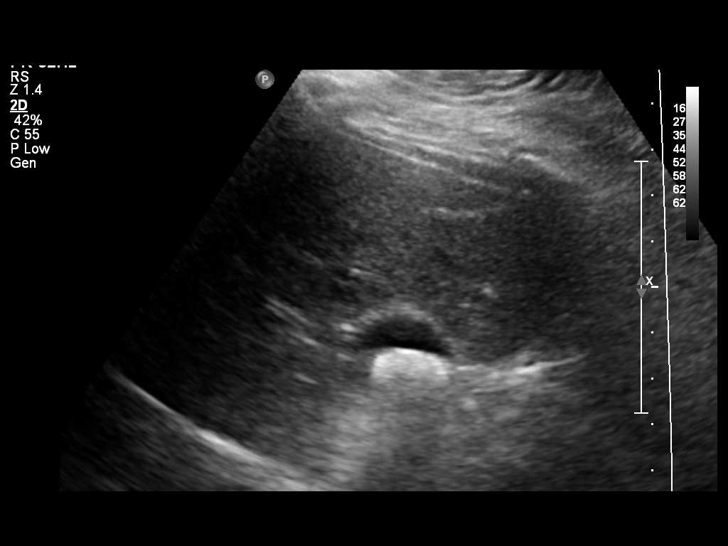
[im 50/93]
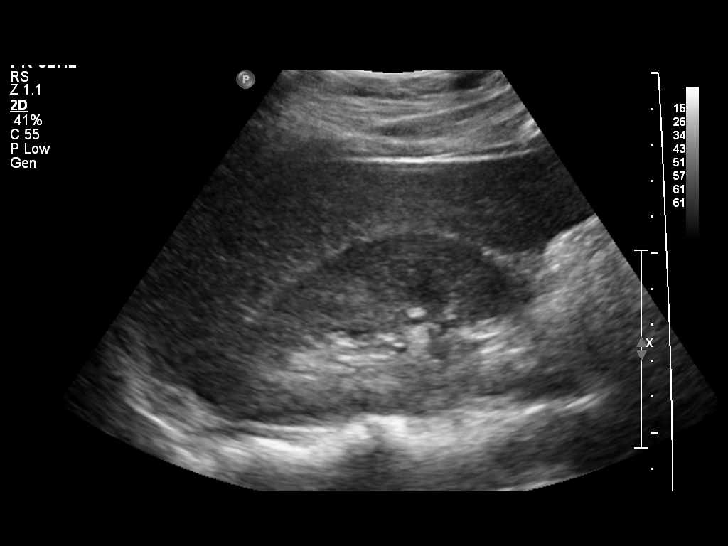
[im 58/93]
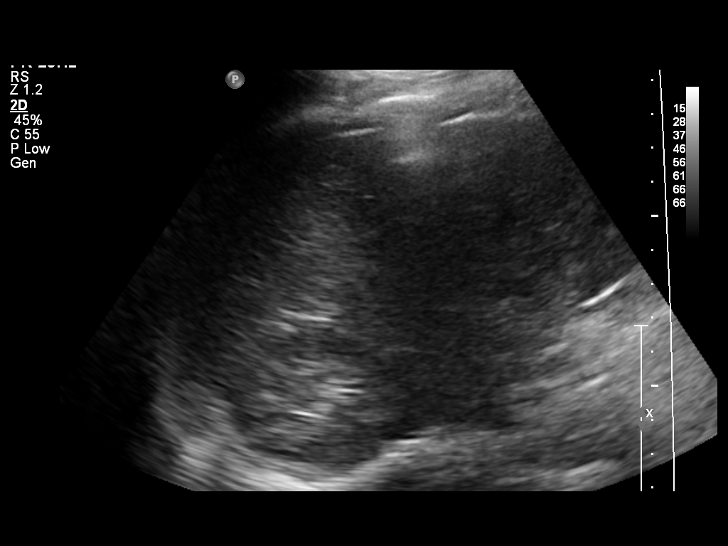
[im 62/93]
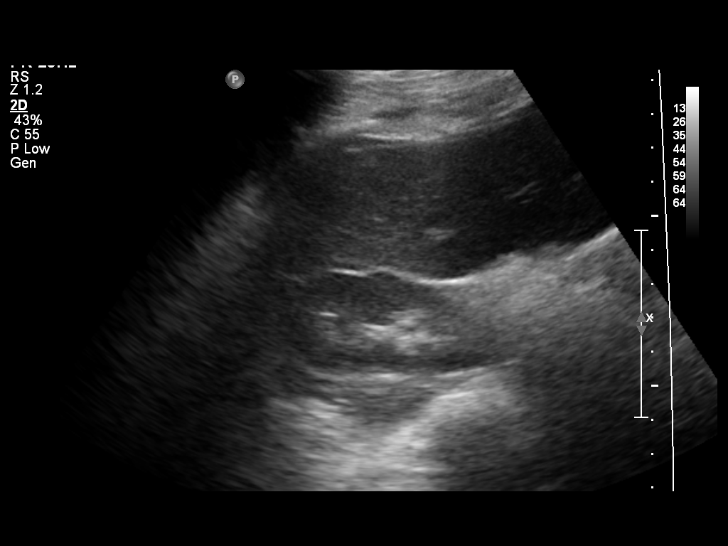
[im 70/93]
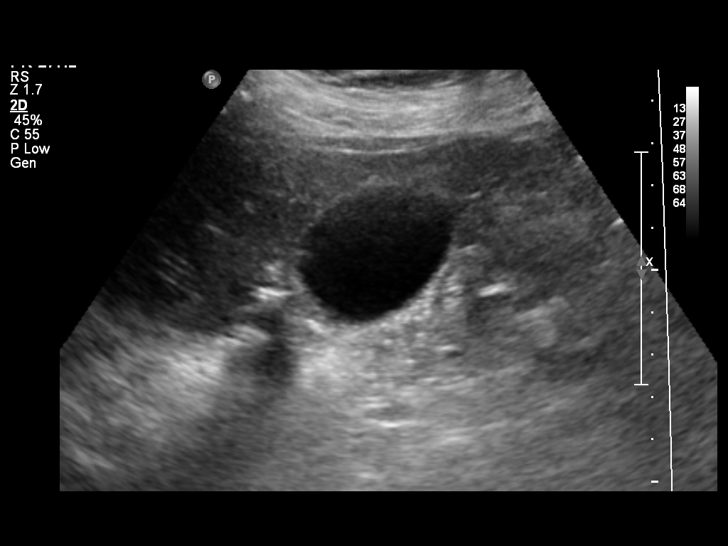
[im 77/93]
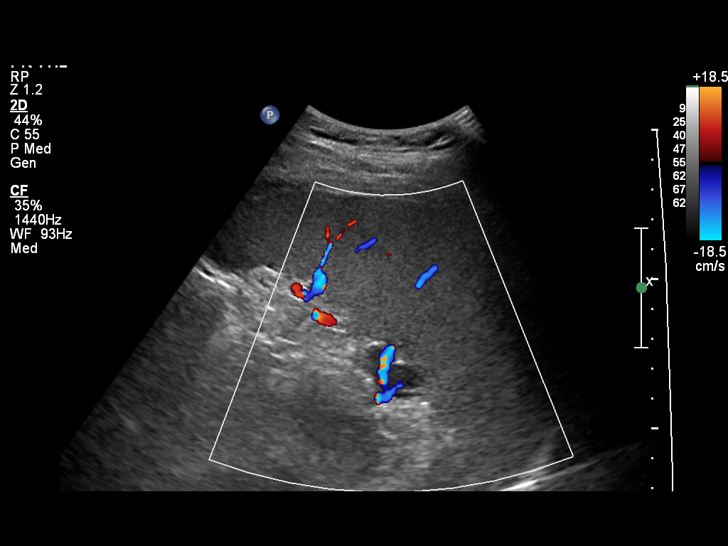
[im 85/93]
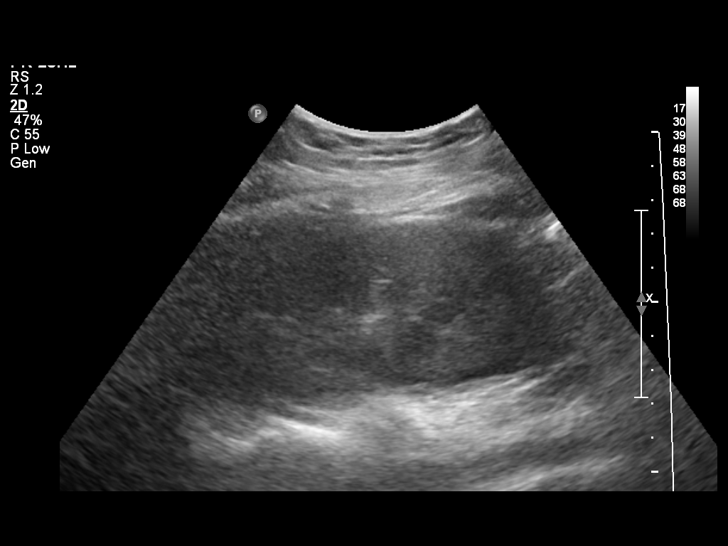
[im 93/93]
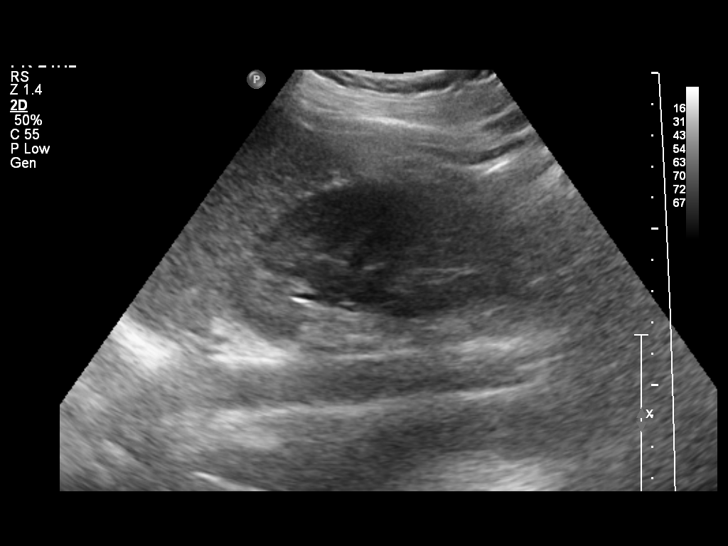

[14 of 25 positions shown; findings below may reference images not displayed]

FINDINGS: Gallbladder:

1.9 cm gallstone within the neck of the gallbladder. No wall
thickening. Negative sonographic Jaylon.

Common bile duct:

Diameter: Normal diameter, 4 mm.

Liver:

No focal lesion identified. Within normal limits in parenchymal
echogenicity.

IVC:

No abnormality visualized.

Pancreas:

Visualized portion unremarkable.

Spleen:

Size and appearance within normal limits.

Right Kidney:

Length: 10.0 cm. Punctate echogenic focus within the right kidney
lower pole. This could be a small parenchymal calcification or
nonobstructing stone in a lower pole calyx.

Left Kidney:

Length: 11.1 cm. Echogenicity within normal limits. No mass or
hydronephrosis visualized.

Abdominal aorta:

No aneurysm visualized.

Other findings:

None.
IMPRESSION: Cholelithiasis.  No sonographic evidence of acute cholecystitis.

Punctate right lower pole parenchymal calcification versus
nonobstructing lower pole stone.

## 2019-05-19 ENCOUNTER — Ambulatory Visit: Payer: Medicaid Other | Attending: Internal Medicine
